# Patient Record
Sex: Female | Born: 1945 | Hispanic: No | Smoking: Former smoker
Health system: Southern US, Community
[De-identification: ages and names within clinical notes are randomized; demographics above are authoritative.]

## PROBLEM LIST (undated history)

## (undated) DIAGNOSIS — M1990 Unspecified osteoarthritis, unspecified site: Secondary | ICD-10-CM

## (undated) DIAGNOSIS — J189 Pneumonia, unspecified organism: Secondary | ICD-10-CM

## (undated) DIAGNOSIS — I10 Essential (primary) hypertension: Secondary | ICD-10-CM

## (undated) DIAGNOSIS — E785 Hyperlipidemia, unspecified: Secondary | ICD-10-CM

## (undated) DIAGNOSIS — J45909 Unspecified asthma, uncomplicated: Secondary | ICD-10-CM

## (undated) HISTORY — DX: Pneumonia, unspecified organism: J18.9

## (undated) HISTORY — DX: Hyperlipidemia, unspecified: E78.5

## (undated) HISTORY — DX: Unspecified asthma, uncomplicated: J45.909

## (undated) HISTORY — PX: OTHER SURGICAL HISTORY: SHX169

## (undated) HISTORY — DX: Unspecified osteoarthritis, unspecified site: M19.90

## (undated) HISTORY — DX: Essential (primary) hypertension: I10

---

## 1970-03-10 HISTORY — PX: TUBAL LIGATION: SHX77

## 2006-01-06 ENCOUNTER — Ambulatory Visit: Payer: Self-pay

## 2007-01-18 ENCOUNTER — Ambulatory Visit: Payer: Self-pay

## 2008-09-11 ENCOUNTER — Emergency Department: Payer: Self-pay | Admitting: Emergency Medicine

## 2009-09-13 ENCOUNTER — Emergency Department: Payer: Self-pay | Admitting: Emergency Medicine

## 2010-10-28 ENCOUNTER — Ambulatory Visit: Payer: Self-pay | Admitting: Family Medicine

## 2010-11-11 ENCOUNTER — Emergency Department: Payer: Self-pay | Admitting: Emergency Medicine

## 2012-01-22 ENCOUNTER — Ambulatory Visit: Payer: Self-pay | Admitting: Family Medicine

## 2013-01-11 ENCOUNTER — Ambulatory Visit: Payer: Self-pay | Admitting: Internal Medicine

## 2013-01-11 LAB — BASIC METABOLIC PANEL
Anion Gap: 2 — ABNORMAL LOW (ref 7–16)
BUN: 8 mg/dL (ref 7–18)
Calcium, Total: 10.1 mg/dL (ref 8.5–10.1)
EGFR (Non-African Amer.): >60
Potassium: 3.5 mmol/L (ref 3.5–5.1)

## 2013-01-11 LAB — HEPATIC FUNCTION PANEL A (ARMC)
Albumin: 3.5 g/dL (ref 3.4–5.0)
Alkaline Phosphatase: 143 U/L — ABNORMAL HIGH (ref 50–136)
Bilirubin,Total: 0.7 mg/dL (ref 0.2–1.0)

## 2013-01-11 LAB — CBC WITH DIFFERENTIAL/PLATELET
Basophil #: 0.1 10*3/uL (ref 0.0–0.1)
Basophil %: 1.1 %
HGB: 14.4 g/dL (ref 12.0–16.0)
Lymphocyte #: 2.1 10*3/uL (ref 1.0–3.6)
MCHC: 34.1 g/dL (ref 32.0–36.0)
Monocyte #: 1 x10 3/mm — ABNORMAL HIGH (ref 0.2–0.9)
Neutrophil #: 6.2 10*3/uL (ref 1.4–6.5)
Neutrophil %: 64.9 %
Platelet: 260 10*3/uL (ref 150–440)
RBC: 4.93 10*6/uL (ref 3.80–5.20)
RDW: 14.1 % (ref 11.5–14.5)

## 2013-01-11 LAB — LIPASE, BLOOD: Lipase: 113 U/L (ref 73–393)

## 2013-01-12 ENCOUNTER — Emergency Department: Payer: Self-pay | Admitting: Emergency Medicine

## 2013-03-21 ENCOUNTER — Emergency Department: Payer: Self-pay | Admitting: Emergency Medicine

## 2013-07-22 ENCOUNTER — Encounter: Payer: Self-pay | Admitting: General Surgery

## 2013-08-08 ENCOUNTER — Ambulatory Visit: Payer: Medicare Other | Admitting: General Surgery

## 2013-08-10 ENCOUNTER — Encounter: Payer: Self-pay | Admitting: *Deleted

## 2013-09-01 ENCOUNTER — Ambulatory Visit: Payer: Self-pay | Admitting: Specialist

## 2013-12-27 DIAGNOSIS — J45909 Unspecified asthma, uncomplicated: Secondary | ICD-10-CM | POA: Insufficient documentation

## 2013-12-27 DIAGNOSIS — M542 Cervicalgia: Secondary | ICD-10-CM | POA: Insufficient documentation

## 2013-12-27 DIAGNOSIS — M25512 Pain in left shoulder: Secondary | ICD-10-CM | POA: Insufficient documentation

## 2013-12-27 DIAGNOSIS — I10 Essential (primary) hypertension: Secondary | ICD-10-CM | POA: Insufficient documentation

## 2014-06-14 ENCOUNTER — Ambulatory Visit
Admit: 2014-06-14 | Disposition: A | Discharge status: Home / Self Care | Payer: Self-pay | Attending: Internal Medicine | Admitting: Internal Medicine

## 2014-06-30 NOTE — Consult Note (Signed)
PATIENT NAME:  Helen May, Helen May MR#:  [id number] DATE OF BIRTH:  07/12/1945  DATE OF CONSULTATION:  01/12/2013  CONSULTING PHYSICIAN:  Harrell Gave A. Elany Felix, MD  REASON FOR CONSULTATION: Suprapubic pain, CT scan with acute diverticulitis.   HISTORY OF PRESENT ILLNESS: Helen May is a pleasant 70 year old African-American female who presents with approximately 2 weeks of suprapubic pain. She says that her pain is usually minimal, except is worse with bowel movements, and is very minimal now. Otherwise, has been doing fine. No nausea or vomiting. He been eating okay. No diarrhea or constipation, dysuria or hematuria. Has never had a feeling like this before. Last colonoscopy, according to her, was 2 years ago at [facility].   PAST MEDICAL HISTORY: As follows:  1. Asthma.  2. Hypertension.   HOME MEDICINES:  1. Metoprolol 25 mg p.o. b.i.d.  2. Lovastatin 20 mg p.o. daily.  3. Hydrochlorothiazide 25 mg p.o. daily.   ALLERGIES: NEOSPORIN AND IV DYE.   SOCIAL HISTORY: Denies tobacco or alcohol use. No illegal drug use.   FAMILY HISTORY: Denies a history of cancer, heart disease, diabetes. Positive for hypertension.   REVIEW OF SYSTEMS: A 12-point review of systems was obtained, with pertinent positives and negatives as above.   PHYSICAL EXAMINATION:  VITAL SIGNS: Temperature 98.2, pulse 80, blood pressure 140/70, respirations 18, 99% on room air.  GENERAL: No acute distress. Alert and oriented x3.  HEAD: Normocephalic, atraumatic.  EYES: No scleral icterus. No conjunctivitis.  FACE: No obvious facial trauma. Normal external nose. Normal external ears.  CHEST: Lungs clear to auscultation. Moving air well.  HEART: Regular rate and rhythm. No murmurs, rubs or gallops.  ABDOMEN: Soft, nontender, nondistended.  EXTREMITIES: Moves all extremities well. Strength 5 out of 5.  NEUROLOGIC: Cranial nerves II through XII grossly intact. Sensation intact in all 4 extremities.   LABORATORY  DATA: Significant for yesterday a white cell count of 9.5, alkaline phosphatase of 143, otherwise unremarkable. Differential is 65%.   IMAGING: CT scan shows sigmoid diverticulitis with some inflammatory change, which may be phlegmon or early developing abscess.   ASSESSMENT AND PLAN: Helen May is a pleasant 69 year old female who presents with diverticulitis. She is in minimal pain. Her vital signs are stable. She does not have an elevated white cell count and is minimally tender on exam. I have offered her admission, but I have equally offered her, as she is reliable and has close family support, outpatient p.o. antibiotics with instructions to call if things worsen or if she is concerned. She would like to do the latter. Will follow up with me in approximately 1 week.   ____________________________ Glena Norfolk. Andreas Sobolewski, MD cal:lb D: 01/12/2013 12:48:41 ET T: 01/12/2013 13:05:00 ET JOB#: [id number]  cc: Harrell Gave A. Taliana Mersereau, MD, <Dictator> Floyde Parkins MD ELECTRONICALLY SIGNED 01/12/2013 15:20

## 2014-10-17 ENCOUNTER — Encounter: Payer: Self-pay | Admitting: *Deleted

## 2014-10-25 ENCOUNTER — Encounter: Payer: Self-pay | Admitting: General Surgery

## 2014-10-25 ENCOUNTER — Ambulatory Visit (INDEPENDENT_AMBULATORY_CARE_PROVIDER_SITE_OTHER): Payer: Medicare Other | Admitting: General Surgery

## 2014-10-25 VITALS — BP 132/84 | HR 77 | Resp 14 | Ht 63.0 in | Wt 179.0 lb

## 2014-10-25 DIAGNOSIS — D1720 Benign lipomatous neoplasm of skin and subcutaneous tissue of unspecified limb: Secondary | ICD-10-CM | POA: Diagnosis not present

## 2014-10-25 NOTE — Patient Instructions (Signed)
Lipoma  A lipoma is a noncancerous (benign) tumor composed of fat cells. They are usually found under the skin (subcutaneous). A lipoma may occur in any tissue of the body that contains fat. Common areas for lipomas to appear include the back, shoulders, buttocks, and thighs. Lipomas are a very common soft tissue growth. They are soft and grow slowly. Most problems caused by a lipoma depend on where it is growing.  DIAGNOSIS   A lipoma can be diagnosed with a physical exam. These tumors rarely become cancerous, but radiographic studies can help determine this for certain. Studies used may include:  · Computerized X-ray scans (CT or CAT scan).  · Computerized magnetic scans (MRI).  TREATMENT   Small lipomas that are not causing problems may be watched. If a lipoma continues to enlarge or causes problems, removal is often the best treatment. Lipomas can also be removed to improve appearance. Surgery is done to remove the fatty cells and the surrounding capsule. Most often, this is done with medicine that numbs the area (local anesthetic). The removed tissue is examined under a microscope to make sure it is not cancerous. Keep all follow-up appointments with your caregiver.  SEEK MEDICAL CARE IF:   · The lipoma becomes larger or hard.  · The lipoma becomes painful, red, or increasingly swollen. These could be signs of infection or a more serious condition.  Document Released: 02/14/2002 Document Revised: 05/19/2011 Document Reviewed: 07/27/2009  ExitCare® Patient Information ©2015 [employer]. This information is not intended to replace advice given to you by your health care provider. Make sure you discuss any questions you have with your health care provider.

## 2014-10-25 NOTE — Progress Notes (Signed)
Patient ID: Helen May, female   DOB: [birth year], 69 y.o.   MRN: [id number]  Chief Complaint  Patient presents with  . Other    lipoma left shoulder    HPI Helen May is a 69 y.o. female here for evaluation of a possible lipoma of the left shoulder. She states she first noticed this about 2 years ago. She states that the area can become tender but no pain. It does seem to change in size at times. It does get irritated by her bra strap.  HPI  Past Medical History  Diagnosis Date  . Asthma   . Pneumonia   . Hyperlipidemia   . Hypertension   . Arthritis     Past Surgical History  Procedure Laterality Date  . Tubal ligation  1972  . 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 Cervical cancer Daughter     Social History Social History  Substance Use Topics  . Smoking status: Former Smoker -- 20 years    Quit date: 03/10/1993  . Smokeless tobacco: Never Used  . Alcohol Use: No    Not on File  Current Outpatient Prescriptions  Medication Sig Dispense Refill  . atorvastatin (LIPITOR) 10 MG tablet Take 10 mg by mouth daily.    . cyclobenzaprine (FLEXERIL) 10 MG tablet Take 10 mg by mouth at bedtime.    . hydrochlorothiazide (HYDRODIURIL) 25 MG tablet Take 25 mg by mouth daily.    . meloxicam (MOBIC) 7.5 MG tablet Take 7.5 mg by mouth daily.     No current facility-administered medications for this visit.    Review of Systems Review of Systems  Constitutional: Negative.   Respiratory: Negative.   Cardiovascular: Negative.   Skin: Negative.     Blood pressure 132/84, pulse 77, resp. rate 14, height 5\' 3"  (1.6 m), weight 179 lb (81.194 kg).  Physical Exam Physical Exam  Constitutional: She is oriented to person, place, and time. She appears well-developed and well-nourished.  Eyes: Conjunctivae are normal.  Neck: Neck supple. No thyromegaly present.  Cardiovascular: Normal rate, regular rhythm and  normal heart sounds.   Pulmonary/Chest: Effort normal and breath sounds normal.  Lymphadenopathy:    She has no cervical adenopathy.  Neurological: She is alert and oriented to person, place, and time.  Skin:       Data Reviewed   Assessment    5 cm mass left shoulder-likely a lipoma. It is symptomatic. Can be excised safely here in office      Plan    Schedule excision of mass in office to be done under local anesthesia, discussed with patient and she is agreeable.     PCP: Dr Vidal Schwalbe 10/25/2014, 2:55 PM

## 2014-10-31 ENCOUNTER — Encounter: Payer: Self-pay | Admitting: General Surgery

## 2014-10-31 ENCOUNTER — Ambulatory Visit (INDEPENDENT_AMBULATORY_CARE_PROVIDER_SITE_OTHER): Payer: Medicare Other | Admitting: General Surgery

## 2014-10-31 VITALS — BP 138/84 | HR 86 | Resp 16 | Ht 63.0 in | Wt 178.0 lb

## 2014-10-31 DIAGNOSIS — D1720 Benign lipomatous neoplasm of skin and subcutaneous tissue of unspecified limb: Secondary | ICD-10-CM

## 2014-10-31 NOTE — Patient Instructions (Addendum)
Keep area clean Ice pack for comfort

## 2014-10-31 NOTE — Progress Notes (Signed)
Here today for excision of a left shoulder mass- possible lipoma.  Procedure note:  Patient was placed supine and slightly in the right lateral position. The mass in question was located on top of the left shoulder. This area was infiltrated with the 10 mL of 1% Xylocaine mixed with 0.5% Marcaine. Area was then prepped with chloral prep and then draped out. The medial lateral incision was made overlying this mass which measured approximately 5 cm. In the subcutaneous plane lobulated lipomatous mass was encountered. This was freed from the surrounding tissue and excised out fully. Bleeding was controlled with cautery and 2 suture ligatures of 3-0 Vicryl. The deeper tissues were closed with interrupted 3-0 Vicryl. Skin was closed with interrupted vertical mattress sutures of 3-0 nylon. Neosporin ointment Telfa gauze and Tegaderm were placed. Procedure was well-tolerated and no immediate problems encountered. Excised tissue was sent for pathology. Patient advised on wound care and is to return in approximately 10 days for removal of the stitches.  Cc: Casilda Carls, M.D.

## 2014-11-01 ENCOUNTER — Encounter: Payer: Self-pay | Admitting: General Surgery

## 2014-11-02 ENCOUNTER — Telehealth: Payer: Self-pay | Admitting: *Deleted

## 2014-11-02 NOTE — Telephone Encounter (Signed)
Patient notified that recent biopsy was benign. She verbalizes understanding.   This patient was instructed to keep follow up appointment as scheduled.

## 2014-11-10 ENCOUNTER — Ambulatory Visit (INDEPENDENT_AMBULATORY_CARE_PROVIDER_SITE_OTHER): Payer: Medicare Other

## 2014-11-10 DIAGNOSIS — D1720 Benign lipomatous neoplasm of skin and subcutaneous tissue of unspecified limb: Secondary | ICD-10-CM

## 2014-11-10 NOTE — Progress Notes (Signed)
Patient ID: Helen May, female   DOB: 02-12-1946, 70 y.o.   MRN: [id number] Patient came in today for a wound check. The wound is clean, with no signs of infection noted. Sutures removed, and steri strips placed. Patient aware of pathology results. Follow up as needed.

## 2015-04-10 ENCOUNTER — Other Ambulatory Visit: Payer: Self-pay | Admitting: Internal Medicine

## 2015-04-10 DIAGNOSIS — R059 Cough, unspecified: Secondary | ICD-10-CM

## 2015-04-10 DIAGNOSIS — R05 Cough: Secondary | ICD-10-CM

## 2015-04-12 ENCOUNTER — Ambulatory Visit: Payer: Medicare Other | Attending: Internal Medicine

## 2015-04-12 DIAGNOSIS — R059 Cough, unspecified: Secondary | ICD-10-CM

## 2015-04-12 DIAGNOSIS — R062 Wheezing: Secondary | ICD-10-CM | POA: Insufficient documentation

## 2015-04-12 DIAGNOSIS — R05 Cough: Secondary | ICD-10-CM | POA: Diagnosis not present

## 2015-04-12 MED ORDER — ALBUTEROL SULFATE (2.5 MG/3ML) 0.083% IN NEBU
2.5000 mg | INHALATION_SOLUTION | Freq: Once | RESPIRATORY_TRACT | Status: AC
  Administered 2015-04-12: 2.5 mg via RESPIRATORY_TRACT
  Filled 2015-04-12: qty 3

## 2015-04-24 ENCOUNTER — Ambulatory Visit (INDEPENDENT_AMBULATORY_CARE_PROVIDER_SITE_OTHER): Payer: Medicare Other | Admitting: Internal Medicine

## 2015-04-24 ENCOUNTER — Encounter: Payer: Self-pay | Admitting: Internal Medicine

## 2015-04-24 VITALS — BP 132/84 | HR 91 | Ht 63.0 in | Wt 183.4 lb

## 2015-04-24 DIAGNOSIS — J4550 Severe persistent asthma, uncomplicated: Secondary | ICD-10-CM

## 2015-04-24 MED ORDER — UMECLIDINIUM BROMIDE 62.5 MCG/INH IN AEPB: 1.0000 | INHALATION_SPRAY | Freq: Every day | RESPIRATORY_TRACT | Status: AC

## 2015-04-24 MED ORDER — PREDNISONE 20 MG PO TABS: 20.0000 mg | ORAL_TABLET | Freq: Every day | ORAL | Status: DC

## 2015-04-24 MED ORDER — PANTOPRAZOLE SODIUM 40 MG PO TBEC: 40.0000 mg | DELAYED_RELEASE_TABLET | Freq: Every day | ORAL | Status: DC

## 2015-04-24 MED ORDER — FLUTICASONE FUROATE-VILANTEROL 100-25 MCG/INH IN AEPB: 1.0000 | INHALATION_SPRAY | Freq: Every day | RESPIRATORY_TRACT | Status: AC

## 2015-04-24 MED ORDER — FLUTICASONE FUROATE-VILANTEROL 100-25 MCG/INH IN AEPB: 1.0000 | INHALATION_SPRAY | Freq: Every day | RESPIRATORY_TRACT | Status: DC

## 2015-04-24 MED ORDER — UMECLIDINIUM BROMIDE 62.5 MCG/INH IN AEPB: 1.0000 | INHALATION_SPRAY | Freq: Every day | RESPIRATORY_TRACT | Status: DC

## 2015-04-24 MED ORDER — ALBUTEROL SULFATE (2.5 MG/3ML) 0.083% IN NEBU: 5.0000 mg | INHALATION_SOLUTION | RESPIRATORY_TRACT | Status: DC | PRN

## 2015-04-24 NOTE — Progress Notes (Signed)
[facility] Consultation      Date: 04/24/2015,   MRN# [id number] VENERA GUIFARRO 07/24/1945 Code Status:  Code Status History    This patient does not have a recorded code status. Please follow your organizational policy for patients in this situation.     Hosp day:@LENGTHOFSTAYDAYS @ Referring MD: @ATDPROV @     PCP:      AdmissionWeight: 183 lb 6.4 oz (83.19 kg)                 CurrentWeight: 183 lb 6.4 oz (83.19 kg) Donnae W Geer is a 70 y.o. old female seen in consultation for ASTHMA at the request of Dr. Rosario Jacks     CHIEF COMPLAINT:   Wheezing and cough   HISTORY OF PRESENT ILLNESS  70 yo AAF seen today for persistent wheezing for last several months Patient Dx with ASTHMA 20 years ago  wheezing has been constant day and night, associated with cough and SOB Patient has been on and off prednisone for last 4 months-there are times the wheezing improves and times that it gets bad Patient has a 40 pack year tobacco abuse  She quit in 1995  She says her wheezing is def worse with smoke She has no fevers, chills or signs of infection at this time She is retired and has worked in Corporate treasurer for [employer] would constantly be getting sick from sick contacts She states that she has tried Advair/Spiriva/ALB nebs/proair She has ad reactions of palpitations,thrush in the past She had seen a Pulmonologist in the past...but no follow up No signs of infection at this time She st No si PAST MEDICAL HISTORY   Past Medical History  Diagnosis Date  . Asthma   . Pneumonia   . Hyperlipidemia   . Hypertension   . Arthritis      SURGICAL HISTORY   Past Surgical History  Procedure Laterality Date  . Tubal ligation  1972  . 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 Cervical cancer Daughter      SOCIAL HISTORY   Social History  Substance Use Topics  . Smoking status:  Former Smoker -- 20 years    Quit date: 03/10/1993  . Smokeless tobacco: Never Used  . Alcohol Use: No     MEDICATIONS    Home Medication:  Current Outpatient Rx  Name  Route  Sig  Dispense  Refill  . albuterol (PROVENTIL) (2.5 MG/3ML) 0.083% nebulizer solution      5 mg.         . azithromycin (ZITHROMAX Z-PAK) 250 MG tablet   Oral   Take 250 mg by mouth.         . cyclobenzaprine (FLEXERIL) 10 MG tablet   Oral   Take 10 mg by mouth as needed (at bedtime).          . hydrochlorothiazide (HYDRODIURIL) 25 MG tablet   Oral   Take 25 mg by mouth daily.         Marland Kitchen ipratropium (ATROVENT) 0.02 % nebulizer solution               . meloxicam (MOBIC) 7.5 MG tablet   Oral   Take 7.5 mg by mouth as needed.          . predniSONE (DELTASONE) 20 MG tablet               .  PROAIR HFA 108 (90 Base) MCG/ACT inhaler                 Dispense as written.     Current Medication:  Current outpatient prescriptions:  .  albuterol (PROVENTIL) (2.5 MG/3ML) 0.083% nebulizer solution, 5 mg., Disp: , Rfl:  .  azithromycin (ZITHROMAX Z-PAK) 250 MG tablet, Take 250 mg by mouth., Disp: , Rfl:  .  cyclobenzaprine (FLEXERIL) 10 MG tablet, Take 10 mg by mouth as needed (at bedtime). , Disp: , Rfl:  .  hydrochlorothiazide (HYDRODIURIL) 25 MG tablet, Take 25 mg by mouth daily., Disp: , Rfl:  .  ipratropium (ATROVENT) 0.02 % nebulizer solution, , Disp: , Rfl:  .  meloxicam (MOBIC) 7.5 MG tablet, Take 7.5 mg by mouth as needed. , Disp: , Rfl:  .  predniSONE (DELTASONE) 20 MG tablet, , Disp: , Rfl:  .  PROAIR HFA 108 (90 Base) MCG/ACT inhaler, , Disp: , Rfl:     ALLERGIES   Review of patient's allergies indicates no known allergies.     REVIEW OF SYSTEMS   Review of Systems  Constitutional: Positive for malaise/fatigue. Negative for fever, chills, weight loss and diaphoresis.  HENT: Positive for congestion. Negative for hearing loss and tinnitus.   Eyes: Negative for  blurred vision and double vision.  Respiratory: Positive for cough, shortness of breath and wheezing. Negative for hemoptysis and sputum production.   Cardiovascular: Negative for chest pain, palpitations, orthopnea and leg swelling.  Gastrointestinal: Negative for heartburn, nausea, vomiting and abdominal pain.  Genitourinary: Negative for dysuria.  Musculoskeletal: Negative for myalgias, back pain and neck pain.  Skin: Negative for rash.  Neurological: Negative for dizziness, tingling and headaches.  Endo/Heme/Allergies: Does not bruise/bleed easily.  Psychiatric/Behavioral: Negative for depression. The patient is not nervous/anxious.      VS: BP 132/84 mmHg  Pulse 91  Ht 5\' 3"  (1.6 m)  Wt 183 lb 6.4 oz (83.19 kg)  BMI 32.50 kg/m2  SpO2 95%     PHYSICAL EXAM  Physical Exam  Constitutional: She is oriented to person, place, and time. She appears well-developed and well-nourished. No distress.  HENT:  Head: Normocephalic and atraumatic.  Mouth/Throat: No oropharyngeal exudate.  Eyes: EOM are normal. Pupils are equal, round, and reactive to light. No scleral icterus.  Neck: Normal range of motion. Neck supple.  Cardiovascular: Normal rate, regular rhythm and normal heart sounds.   No murmur heard. Pulmonary/Chest: No stridor. No respiratory distress. She has wheezes. She has rales.  Abdominal: Soft. Bowel sounds are normal. There is no tenderness.  Musculoskeletal: Normal range of motion. She exhibits no edema.  Neurological: She is alert and oriented to person, place, and time. She displays normal reflexes. Coordination normal.  Skin: Skin is warm. She is not diaphoretic.  Psychiatric: She has a normal mood and affect.      ASSESSMENT/PLAN   70 yo AAF seen today for severe persistent ASTHMA since 3 months with intermittent prednisone tapers and abx  Patient will need aggressive BD therapy and will need close follow up, will need PFT;s at some point to assess resp  status/function  1.start Breo 2.start Incruse 3.ALB neb every 6 hrs 4.prednisone 40 mg daily for 10 days 5.start protonix 6.allergy referral  Close Follow up in 2 weeks  The Patient requires high complexity decision making for assessment and support, frequent evaluation and titration of therapies, application of advanced monitoring technologies and extensive interpretation of multiple databases.   Patient satisfied with Plan  of action and management. All questions answered  Corrin Parker, M.D.  [facility]  Medical Director [place] Director [facility] Cardio-Pulmonary Department

## 2015-04-24 NOTE — Patient Instructions (Addendum)
Asthma Attack Prevention While you may not be able to control the fact that you have asthma, you can take actions to prevent asthma attacks. The best way to prevent asthma attacks is to maintain good control of your asthma. You can achieve this by:  Taking your medicines as directed.  Avoiding things that can irritate your airways or make your asthma symptoms worse (asthma triggers).  Keeping track of how well your asthma is controlled and of any changes in your symptoms.  Responding quickly to worsening asthma symptoms (asthma attack).  Seeking emergency care when it is needed. WHAT ARE SOME WAYS TO PREVENT AN ASTHMA ATTACK? Have a Plan Work with your health care provider to create a written plan for managing and treating your asthma attacks (asthma action plan). This plan includes:  A list of your asthma triggers and how you can avoid them.  Information on when medicines should be taken and when their dosages should be changed.  The use of a device that measures how well your lungs are working (peak flow meter). Monitor Your Asthma Use your peak flow meter and record your results in a journal every day. A drop in your peak flow numbers on one or more days may indicate the start of an asthma attack. This can happen even before you start to feel symptoms. You can prevent an asthma attack from getting worse by following the steps in your asthma action plan. Avoid Asthma Triggers Work with your asthma health care provider to find out what your asthma triggers are. This can be done by:  Allergy testing.  Keeping a journal that notes when asthma attacks occur and the factors that may have contributed to them.  Determining if there are other medical conditions that are making your asthma worse. Once you have determined your asthma triggers, take steps to avoid them. This may include avoiding excessive or prolonged exposure to:  Dust. Have someone dust and vacuum your home for you once or  twice a week. Using a high-efficiency particulate arrestance (HEPA) vacuum is best.  Smoke. This includes campfire smoke, forest fire smoke, and secondhand smoke from tobacco products.  Pet dander. Avoid contact with animals that you know you are allergic to.  Allergens from trees, grasses or pollens. Avoid spending a lot of time outdoors when pollen counts are high, and on very windy days.  Very cold, dry, or humid air.  Mold.  Foods that contain high amounts of sulfites.  Strong odors.  Outdoor air pollutants, such as engine exhaust.  Indoor air pollutants, such as aerosol sprays and fumes from household cleaners.  Household pests, including dust mites and cockroaches, and pest droppings.  Certain medicines, including NSAIDs. Always talk to your health care provider before stopping or starting any new medicines. Medicines Take over-the-counter and prescription medicines only as told by your health care provider. Many asthma attacks can be prevented by carefully following your medicine schedule. Taking your medicines correctly is especially important when you cannot avoid certain asthma triggers. Act Quickly If an asthma attack does happen, acting quickly can decrease how severe it is and how long it lasts. Take these steps:   Pay attention to your symptoms. If you are coughing, wheezing, or having difficulty breathing, do not wait to see if your symptoms go away on their own. Follow your asthma action plan.  If you have followed your asthma action plan and your symptoms are not improving, call your health care provider or seek immediate medical care   at the nearest hospital. It is important to note how often you need to use your fast-acting rescue inhaler. If you are using your rescue inhaler more often, it may mean that your asthma is not under control. Adjusting your asthma treatment plan may help you to prevent future asthma attacks and help you to gain better control of your  condition. HOW CAN I PREVENT AN ASTHMA ATTACK WHEN I EXERCISE? Follow advice from your health care provider about whether you should use your fast-acting inhaler before exercising. Many people with asthma experience exercise-induced bronchoconstriction (EIB). This condition often worsens during vigorous exercise in cold, humid, or dry environments. Usually, people with EIB can stay very active by pre-treating with a fast-acting inhaler before exercising.   This information is not intended to replace advice given to you by your health care provider. Make sure you discuss any questions you have with your health care provider.   Document Released: 02/12/2009 Document Revised: 11/15/2014 Document Reviewed: 07/27/2014 Elsevier Interactive Patient Education 2016 [place] once daily START INCRUSE once daily Take ALB SOLUTION EVERY 6 hrs Prednisone 40 mg daily for 10 days Protonix 40 mg daily

## 2015-04-25 ENCOUNTER — Other Ambulatory Visit: Payer: Self-pay

## 2015-04-25 DIAGNOSIS — J4550 Severe persistent asthma, uncomplicated: Secondary | ICD-10-CM

## 2015-05-01 ENCOUNTER — Institutional Professional Consult (permissible substitution): Payer: Self-pay | Admitting: Internal Medicine

## 2015-05-09 ENCOUNTER — Telehealth: Payer: Self-pay | Admitting: Pulmonary Disease

## 2015-05-09 NOTE — Telephone Encounter (Signed)
Spoke with pt and she states that she took Protonix about 9am and states that around 11am that she started having sharp chest pains with pain on the Rt side of her neck and in her Rt shoulder and shoulder blade. States it went away for a little bit but has now came back with the same symptoms. Ask pt if she felt like someone was sitting on her chest and she denies it. Informed pt she should go to ER to be evaluated. Ask pt if someone was there with her that could take her and she replied yes. Pt kept saying she doesn't have a cardiac hx. Informed pt she still needed to be checked out at the ER and she states she would go.  Spoke with VM and he agrees with what pt was told.

## 2015-05-09 NOTE — Telephone Encounter (Signed)
Pt calling stating all day today she's been having pains. Neck back and in chest Thinks it is her PROTONIX She looked up some side effects to this and thinks this is why she's having those pain Would like to know what to do Please advise.

## 2015-05-10 ENCOUNTER — Ambulatory Visit (INDEPENDENT_AMBULATORY_CARE_PROVIDER_SITE_OTHER): Payer: Medicare Other | Admitting: Pulmonary Disease

## 2015-05-10 ENCOUNTER — Encounter: Payer: Self-pay | Admitting: Pulmonary Disease

## 2015-05-10 VITALS — BP 134/78 | HR 118 | Ht 63.0 in | Wt 181.6 lb

## 2015-05-10 DIAGNOSIS — Z9114 Patient's other noncompliance with medication regimen: Secondary | ICD-10-CM | POA: Diagnosis not present

## 2015-05-10 DIAGNOSIS — J4551 Severe persistent asthma with (acute) exacerbation: Secondary | ICD-10-CM

## 2015-05-10 MED ORDER — IPRATROPIUM-ALBUTEROL 0.5-2.5 (3) MG/3ML IN SOLN
3.0000 mL | Freq: Once | RESPIRATORY_TRACT | Status: AC
  Administered 2015-05-10: 3 mL via RESPIRATORY_TRACT

## 2015-05-10 MED ORDER — MONTELUKAST SODIUM 10 MG PO TABS: 10.0000 mg | ORAL_TABLET | Freq: Every day | ORAL | Status: DC

## 2015-05-10 NOTE — Patient Instructions (Signed)
You received a nebulized treatment today in the office When you go home, take your Breo and Incruse inhalers as prescribed Stop Protonix Continue prednisone as previously prescribed by Dr Mortimer Fries I have added a new asthma medication called Singulair to be taken once a day at bedtime We will call you tomorrow to make sure you are feeling better  Follow up with me next week on Monday

## 2015-05-10 NOTE — Progress Notes (Signed)
ACUTE PULMONARY OFFICE VISIT  PT PROFILE: 7 F pt of Dr Mortimer Fries with severe persistent asthma initially seen in consultation 04/24/15. Started on Breo, Incruse, Protonix and given prednisone 40 mg daily X 10 days.  SUBJ: Started protonix yesterday and developed HA and severe R sided chest pain (she believes this is cause and effect). Called office to be seen today. C/O severe dyspnea. R sided CP is subsiding. She has not taken any of the previously prescribed meds on the day of this encounter. Denies fever, purulent sputum, hemoptysis  OBJ:  Filed Vitals:   05/10/15 0925  BP: 134/78  Pulse: 118  Height: 5\' 3"  (1.6 m)  Weight: 181 lb 9.6 oz (82.373 kg)  SpO2: 100%   Appears moderately dyspneic @ rest HEENT: All WNL Neck: NO LAN, no JVD noted Lungs: full BS, diffuse scattered wheezes, + pseudowheeze Cardiovascular: Reg rate, normal rhythm, no M noted Abdomen: Soft, NT +BS Ext: no C/C/E Neuro: grossly intact  She received nebulized albuterol and ipratropium in the office with improvement in dyspnea. Wheezing remained very prominent after the treatment  No new data  IMPRESSION: Severe persistent asthma with acute exacerbation Possible adverse reaction to Protonix Medical noncompliance  PLAN: Continue Rx as outlined in Dr Zoila Shutter previous note except Protonix I counseled re: the importance of using her controlled meds as prescibed If we continue to believe GERD is an exacerbating factor, we can consider H2RA in the future Our office will cal her tomorrow to check on her ROV 05/14/15 with me for recheck In the long term, she might be a candidate for Nucala or Xolair  Merton Border, MD PCCM service Mobile [phone] Pager [phone]

## 2015-05-11 ENCOUNTER — Telehealth: Payer: Self-pay | Admitting: *Deleted

## 2015-05-11 NOTE — Telephone Encounter (Signed)
Tried to call pt to see how she is doing today per DS. LMOM for pt to call back.

## 2015-05-14 ENCOUNTER — Ambulatory Visit: Payer: Medicare Other | Admitting: Pulmonary Disease

## 2015-05-14 NOTE — Telephone Encounter (Signed)
Tried to call pt to f/u to see how she is doing. LMOM. Will close telephone note.

## 2015-05-24 ENCOUNTER — Ambulatory Visit: Payer: Medicare Other | Admitting: Internal Medicine

## 2015-10-15 ENCOUNTER — Other Ambulatory Visit: Payer: Self-pay | Admitting: Internal Medicine

## 2015-10-15 DIAGNOSIS — Z1231 Encounter for screening mammogram for malignant neoplasm of breast: Secondary | ICD-10-CM

## 2015-10-30 ENCOUNTER — Ambulatory Visit
Admission: RE | Admit: 2015-10-30 | Discharge: 2015-10-30 | Disposition: A | Discharge status: Home / Self Care | Payer: Medicare Other | Source: Ambulatory Visit | Attending: Internal Medicine | Admitting: Internal Medicine

## 2015-10-30 ENCOUNTER — Other Ambulatory Visit: Payer: Self-pay | Admitting: Internal Medicine

## 2015-10-30 ENCOUNTER — Telehealth: Payer: Self-pay | Admitting: Gastroenterology

## 2015-10-30 DIAGNOSIS — Z1231 Encounter for screening mammogram for malignant neoplasm of breast: Secondary | ICD-10-CM | POA: Insufficient documentation

## 2015-10-30 NOTE — Telephone Encounter (Signed)
colonoscopy

## 2015-10-31 NOTE — Telephone Encounter (Signed)
Called patient and LVM.

## 2015-11-02 NOTE — Telephone Encounter (Signed)
Called patient and LVM.

## 2015-11-02 NOTE — Telephone Encounter (Signed)
Notification letter sent to address on file

## 2015-12-31 ENCOUNTER — Telehealth: Payer: Self-pay | Admitting: Gastroenterology

## 2015-12-31 NOTE — Telephone Encounter (Signed)
Colonoscopy please schedule with Maricela Curet. Also, please double check insurance and add. Thank you :)

## 2016-01-02 ENCOUNTER — Other Ambulatory Visit: Payer: Self-pay

## 2016-01-02 NOTE — Telephone Encounter (Signed)
Gastroenterology Pre-Procedure Review  Request Date: 01/10/2016 Requesting Physician: Dr. Vicente Males  PATIENT REVIEW QUESTIONS: The patient responded to the following health history questions as indicated:    1. Are you having any GI issues? no 2. Do you have a personal history of Polyps? yes (yes, benign) 3. Do you have a family history of Colon Cancer or Polyps? no 4. Diabetes Mellitus? no 5. Joint replacements in the past 12 months?no 6. Major health problems in the past 3 months?no 7. Any artificial heart valves, MVP, or defibrillator?no    MEDICATIONS & ALLERGIES:    Patient reports the following regarding taking any anticoagulation/antiplatelet therapy:   Plavix, Coumadin, Eliquis, Xarelto, Lovenox, Pradaxa, Brilinta, or Effient? no Aspirin? no  Patient confirms/reports the following medications:  Current Outpatient Prescriptions  Medication Sig Dispense Refill  . hydrochlorothiazide (HYDRODIURIL) 25 MG tablet Take 25 mg by mouth daily.    Marland Kitchen ipratropium (ATROVENT) 0.02 % nebulizer solution     . meloxicam (MOBIC) 7.5 MG tablet Take 7.5 mg by mouth as needed.     . montelukast (SINGULAIR) 10 MG tablet Take 1 tablet (10 mg total) by mouth at bedtime. 30 tablet 11  . predniSONE (DELTASONE) 20 MG tablet Reported on 05/10/2015    . umeclidinium bromide (INCRUSE ELLIPTA) 62.5 MCG/INH AEPB Inhale 1 puff into the lungs daily. 30 each 5   No current facility-administered medications for this visit.     Patient confirms/reports the following allergies:  No Known Allergies  No orders of the defined types were placed in this encounter.   AUTHORIZATION INFORMATION Primary Insurance: 1D#: Group #:  Secondary Insurance: 1D#: Group #:  SCHEDULE INFORMATION: Date: 01/10/2016 Time: Location: [facility]

## 2016-01-02 NOTE — Telephone Encounter (Signed)
Screening Colonoscopy Z12.11 [facility] [id number] [employer] Pre cert

## 2016-01-03 ENCOUNTER — Other Ambulatory Visit: Payer: Self-pay

## 2016-01-03 NOTE — Telephone Encounter (Signed)
Please pre cert this one for me. Thank you

## 2016-01-03 NOTE — Telephone Encounter (Signed)
Approved--[id number]

## 2016-01-09 ENCOUNTER — Encounter: Payer: Self-pay | Admitting: *Deleted

## 2016-01-14 ENCOUNTER — Telehealth: Payer: Self-pay | Admitting: Gastroenterology

## 2016-01-14 NOTE — Telephone Encounter (Signed)
Patient needs to cancel colonoscopy Thursday and will call back to reschedule. No transportation

## 2016-01-17 ENCOUNTER — Encounter: Admission: RE | Payer: Self-pay | Source: Ambulatory Visit

## 2016-01-17 ENCOUNTER — Ambulatory Visit: Admission: RE | Admit: 2016-01-17 | Payer: Medicare Other | Source: Ambulatory Visit

## 2016-01-17 SURGERY — COLONOSCOPY WITH PROPOFOL
Anesthesia: General

## 2016-04-10 ENCOUNTER — Other Ambulatory Visit: Payer: Self-pay | Admitting: Internal Medicine

## 2016-04-10 ENCOUNTER — Ambulatory Visit
Admission: RE | Admit: 2016-04-10 | Discharge: 2016-04-10 | Disposition: A | Discharge status: Home / Self Care | Payer: Medicare Other | Source: Ambulatory Visit | Attending: Internal Medicine | Admitting: Internal Medicine

## 2016-04-10 DIAGNOSIS — M79662 Pain in left lower leg: Secondary | ICD-10-CM

## 2016-04-10 DIAGNOSIS — M79609 Pain in unspecified limb: Secondary | ICD-10-CM | POA: Diagnosis present

## 2017-11-18 ENCOUNTER — Emergency Department: Payer: Medicare Other

## 2017-11-18 ENCOUNTER — Inpatient Hospital Stay
Admission: EM | Admit: 2017-11-18 | Discharge: 2017-11-22 | DRG: 392 | Disposition: A | Discharge status: Home / Self Care | Payer: Medicare Other | Attending: Internal Medicine | Admitting: Internal Medicine

## 2017-11-18 ENCOUNTER — Encounter: Payer: Self-pay | Admitting: Emergency Medicine

## 2017-11-18 ENCOUNTER — Other Ambulatory Visit: Payer: Self-pay

## 2017-11-18 DIAGNOSIS — D179 Benign lipomatous neoplasm, unspecified: Secondary | ICD-10-CM | POA: Diagnosis present

## 2017-11-18 DIAGNOSIS — Z8049 Family history of malignant neoplasm of other genital organs: Secondary | ICD-10-CM

## 2017-11-18 DIAGNOSIS — R109 Unspecified abdominal pain: Secondary | ICD-10-CM

## 2017-11-18 DIAGNOSIS — J45909 Unspecified asthma, uncomplicated: Secondary | ICD-10-CM | POA: Diagnosis present

## 2017-11-18 DIAGNOSIS — I10 Essential (primary) hypertension: Secondary | ICD-10-CM | POA: Diagnosis present

## 2017-11-18 DIAGNOSIS — Z9851 Tubal ligation status: Secondary | ICD-10-CM

## 2017-11-18 DIAGNOSIS — E739 Lactose intolerance, unspecified: Secondary | ICD-10-CM | POA: Diagnosis present

## 2017-11-18 DIAGNOSIS — K5720 Diverticulitis of large intestine with perforation and abscess without bleeding: Principal | ICD-10-CM | POA: Diagnosis present

## 2017-11-18 DIAGNOSIS — B961 Klebsiella pneumoniae [K. pneumoniae] as the cause of diseases classified elsewhere: Secondary | ICD-10-CM | POA: Diagnosis present

## 2017-11-18 DIAGNOSIS — E785 Hyperlipidemia, unspecified: Secondary | ICD-10-CM | POA: Diagnosis present

## 2017-11-18 DIAGNOSIS — Z87891 Personal history of nicotine dependence: Secondary | ICD-10-CM

## 2017-11-18 LAB — CBC
HCT: 41.2 % (ref 35.0–47.0)
Hemoglobin: 14 g/dL (ref 12.0–16.0)
MCH: 30.1 pg (ref 26.0–34.0)
MCHC: 33.9 g/dL (ref 32.0–36.0)
MCV: 88.7 fL (ref 80.0–100.0)
PLATELETS: 255 10*3/uL (ref 150–440)
RBC: 4.64 MIL/uL (ref 3.80–5.20)
RDW: 14.4 % (ref 11.5–14.5)
WBC: 9.3 10*3/uL (ref 3.6–11.0)

## 2017-11-18 LAB — URINALYSIS, COMPLETE (UACMP) WITH MICROSCOPIC
Bacteria, UA: NONE SEEN
Bilirubin Urine: NEGATIVE
GLUCOSE, UA: NEGATIVE mg/dL
HGB URINE DIPSTICK: NEGATIVE
KETONES UR: NEGATIVE mg/dL
Leukocytes, UA: NEGATIVE
NITRITE: NEGATIVE
PH: 7 (ref 5.0–8.0)
Protein, ur: NEGATIVE mg/dL
Specific Gravity, Urine: 1.016 (ref 1.005–1.030)

## 2017-11-18 LAB — COMPREHENSIVE METABOLIC PANEL
ALK PHOS: 94 U/L (ref 38–126)
ALT: 17 U/L (ref 0–44)
ANION GAP: 7 (ref 5–15)
AST: 15 U/L (ref 15–41)
Albumin: 3.8 g/dL (ref 3.5–5.0)
BILIRUBIN TOTAL: 0.7 mg/dL (ref 0.3–1.2)
BUN: 10 mg/dL (ref 8–23)
CALCIUM: 10.4 mg/dL — AB (ref 8.9–10.3)
CO2: 25 mmol/L (ref 22–32)
CREATININE: 0.77 mg/dL (ref 0.44–1.00)
Chloride: 106 mmol/L (ref 98–111)
Glucose, Bld: 111 mg/dL — ABNORMAL HIGH (ref 70–99)
Potassium: 3.7 mmol/L (ref 3.5–5.1)
SODIUM: 138 mmol/L (ref 135–145)
TOTAL PROTEIN: 7.9 g/dL (ref 6.5–8.1)

## 2017-11-18 LAB — LIPASE, BLOOD: Lipase: 28 U/L (ref 11–51)

## 2017-11-18 MED ORDER — ONDANSETRON HCL 4 MG/2ML IJ SOLN
4.00 | INTRAMUSCULAR | Status: DC
Start: ? — End: 2017-11-18

## 2017-11-18 MED ORDER — IPRATROPIUM BROMIDE 0.02 % IN SOLN: 0.5000 mg | Freq: Four times a day (QID) | RESPIRATORY_TRACT | Status: DC | PRN

## 2017-11-18 MED ORDER — MORPHINE SULFATE (PF) 4 MG/ML IV SOLN
4.0000 mg | INTRAVENOUS | Status: DC | PRN
  Administered 2017-11-19 – 2017-11-20 (×2): 4 mg via INTRAVENOUS
  Filled 2017-11-18 (×2): qty 1

## 2017-11-18 MED ORDER — ONDANSETRON HCL 4 MG/2ML IJ SOLN: 4.0000 mg | Freq: Four times a day (QID) | INTRAMUSCULAR | Status: DC | PRN

## 2017-11-18 MED ORDER — METRONIDAZOLE IN NACL 5-0.79 MG/ML-% IV SOLN
500.0000 mg | Freq: Three times a day (TID) | INTRAVENOUS | Status: DC
  Administered 2017-11-18 – 2017-11-19 (×2): 500 mg via INTRAVENOUS
  Filled 2017-11-18 (×6): qty 100

## 2017-11-18 MED ORDER — MORPHINE SULFATE (PF) 4 MG/ML IV SOLN
4.0000 mg | INTRAVENOUS | Status: DC | PRN
  Administered 2017-11-18 (×2): 4 mg via INTRAVENOUS
  Filled 2017-11-18 (×2): qty 1

## 2017-11-18 MED ORDER — SODIUM CHLORIDE 0.9 % IV SOLN
2.0000 g | INTRAVENOUS | Status: DC
  Administered 2017-11-18: 2 g via INTRAVENOUS
  Filled 2017-11-18 (×2): qty 20

## 2017-11-18 MED ORDER — HYDRALAZINE HCL 20 MG/ML IJ SOLN
10.00 | INTRAMUSCULAR | Status: DC
Start: ? — End: 2017-11-18

## 2017-11-18 MED ORDER — OXYCODONE HCL 5 MG PO TABS
5.00 | ORAL_TABLET | ORAL | Status: DC
Start: ? — End: 2017-11-18

## 2017-11-18 MED ORDER — PIPERACILLIN SOD-TAZOBACTAM SO 3.375 (3-0.375) G IV SOLR
3.38 | INTRAVENOUS | Status: DC
Start: 2017-11-17 — End: 2017-11-18

## 2017-11-18 MED ORDER — LABETALOL HCL 5 MG/ML IV SOLN
5.0000 mg | Freq: Once | INTRAVENOUS | Status: AC
  Administered 2017-11-18: 5 mg via INTRAVENOUS
  Filled 2017-11-18: qty 4

## 2017-11-18 MED ORDER — MORPHINE SULFATE (PF) 2 MG/ML IV SOLN: 2.0000 mg | INTRAVENOUS | Status: DC | PRN

## 2017-11-18 MED ORDER — DEXTROSE IN LACTATED RINGERS 5 % IV SOLN
100.00 | INTRAVENOUS | Status: DC
Start: ? — End: 2017-11-18

## 2017-11-18 MED ORDER — SODIUM CHLORIDE 0.9 % IV BOLUS
500.0000 mL | Freq: Once | INTRAVENOUS | Status: AC
  Administered 2017-11-18: 500 mL via INTRAVENOUS

## 2017-11-18 MED ORDER — ACETAMINOPHEN 10 MG/ML IV SOLN
650.00 | INTRAVENOUS | Status: DC
Start: ? — End: 2017-11-18

## 2017-11-18 MED ORDER — MONTELUKAST SODIUM 10 MG PO TABS
10.0000 mg | ORAL_TABLET | Freq: Every day | ORAL | Status: DC
  Filled 2017-11-18 (×2): qty 1

## 2017-11-18 MED ORDER — SODIUM CHLORIDE 0.9 % IV SOLN
INTRAVENOUS | Status: DC
  Administered 2017-11-18 – 2017-11-21 (×7): via INTRAVENOUS

## 2017-11-18 MED ORDER — ALBUTEROL SULFATE (2.5 MG/3ML) 0.083% IN NEBU
5.00 | INHALATION_SOLUTION | RESPIRATORY_TRACT | Status: DC
Start: ? — End: 2017-11-18

## 2017-11-18 MED ORDER — HEPARIN SODIUM (PORCINE) 5000 UNIT/ML IJ SOLN
5000.0000 [IU] | Freq: Three times a day (TID) | INTRAMUSCULAR | Status: DC
  Administered 2017-11-21 – 2017-11-22 (×3): 5000 [IU] via SUBCUTANEOUS
  Filled 2017-11-18 (×7): qty 1

## 2017-11-18 MED ORDER — MORPHINE SULFATE (PF) 2 MG/ML IV SOLN
2.0000 mg | INTRAVENOUS | Status: DC | PRN
  Administered 2017-11-19 (×3): 2 mg via INTRAVENOUS
  Filled 2017-11-18 (×3): qty 1

## 2017-11-18 MED ORDER — DOCUSATE SODIUM 100 MG PO CAPS: 100.0000 mg | ORAL_CAPSULE | Freq: Two times a day (BID) | ORAL | Status: DC | PRN

## 2017-11-18 MED ORDER — MORPHINE SULFATE 2 MG/ML IJ SOLN
2.00 | INTRAMUSCULAR | Status: DC
Start: ? — End: 2017-11-18

## 2017-11-18 MED ORDER — ONDANSETRON HCL 4 MG/2ML IJ SOLN
4.0000 mg | Freq: Once | INTRAMUSCULAR | Status: AC
  Administered 2017-11-18: 4 mg via INTRAVENOUS
  Filled 2017-11-18: qty 2

## 2017-11-18 MED ORDER — HYDROCHLOROTHIAZIDE 25 MG PO TABS
25.0000 mg | ORAL_TABLET | Freq: Every day | ORAL | Status: DC
  Administered 2017-11-19 – 2017-11-22 (×3): 25 mg via ORAL
  Filled 2017-11-18 (×5): qty 1

## 2017-11-18 MED ORDER — HEPARIN SODIUM (PORCINE) 5000 UNIT/ML IJ SOLN
5000.00 | INTRAMUSCULAR | Status: DC
Start: 2017-11-17 — End: 2017-11-18

## 2017-11-18 NOTE — ED Notes (Signed)
ED Provider at bedside. 

## 2017-11-18 NOTE — H&P (Signed)
[place] [facility] [place] NAME: Helen May    MR#:  [id number]  DATE OF BIRTH:  [birth year]  DATE OF ADMISSION:  11/18/2017  PRIMARY CARE PHYSICIAN: Casilda Carls, MD (Inactive)   REQUESTING/REFERRING PHYSICIAN: Quentin Cornwall  CHIEF COMPLAINT:   Chief Complaint  Patient presents with  . Abdominal Pain    HISTORY OF PRESENT ILLNESS: Helen May  is a 72 y.o. female with a known history of arthritis, asthma, hyperlipidemia, hypertension, pneumonia-started having abdominal pain mainly on the left lower side for last 4 to 5 days which is constant and worse after eating.  No associated vomiting or diarrhea.  No fever or chills.  Concerned with this she tried to call her primary care doctor's office but could not speak to him so yesterday went to [facility] emergency room.  They had seen her and done a CT scan on the abdomen which showed sigmoid diverticulitis with small abscess, there was a long delay in her care as per the patient and she was not supposed to get the room for another 1 day so she called her primary care doctor's office and he suggested to come here. Patient signed Belle [facility] from [facility] they gave her prescriptions on oral antibiotics but today her pain was still out of control so she called Dr.Jadali's office he suggested to get admitted here and he will contact Dr. Bary Castilla. ER physician had tried contacting Dr. Charissa Bash but no response from him so finally spoke to on-call surgical Dr. Reita May suggested to admit to medical service for IV antibiotics and if needed then they would call IR for drainage.  PAST MEDICAL HISTORY:   Past Medical History:  Diagnosis Date  . Arthritis   . Asthma   . Hyperlipidemia   . Hypertension   . Pneumonia     PAST SURGICAL HISTORY:  Past Surgical History:  Procedure Laterality Date  . Removal of Carotid Mass Right 15 years ago  . TUBAL LIGATION  1972    SOCIAL HISTORY:  Social  History   Tobacco Use  . Smoking status: Former Smoker    Years: 20.00    Last attempt to quit: 03/10/1993    Years since quitting: 24.7  . Smokeless tobacco: Never Used  Substance Use Topics  . Alcohol use: No    Alcohol/week: 0.0 standard drinks    FAMILY HISTORY:  Family History  Problem Relation Age of Onset  . Cervical cancer Daughter   . Breast cancer Neg Hx     DRUG ALLERGIES: No Known Allergies  REVIEW OF SYSTEMS:   CONSTITUTIONAL: No fever, fatigue or weakness.  EYES: No blurred or double vision.  EARS, NOSE, AND THROAT: No tinnitus or ear pain.  RESPIRATORY: No cough, shortness of breath, wheezing or hemoptysis.  CARDIOVASCULAR: No chest pain, orthopnea, edema.  GASTROINTESTINAL: No nausea, vomiting, diarrhea , she have abdominal pain.  GENITOURINARY: No dysuria, hematuria.  ENDOCRINE: No polyuria, nocturia,  HEMATOLOGY: No anemia, easy bruising or bleeding SKIN: No rash or lesion. MUSCULOSKELETAL: No joint pain or arthritis.   NEUROLOGIC: No tingling, numbness, weakness.  PSYCHIATRY: No anxiety or depression.   MEDICATIONS AT HOME:  Prior to Admission medications   Medication Sig Start Date End Date Taking? Authorizing Provider  hydrochlorothiazide (HYDRODIURIL) 25 MG tablet Take 25 mg by mouth daily.    [provider]  ipratropium (ATROVENT) 0.02 % nebulizer solution  04/20/15 05/04/15  [provider]  meloxicam (MOBIC) 7.5 MG tablet Take  7.5 mg by mouth as needed.     [provider]  montelukast (SINGULAIR) 10 MG tablet Take 1 tablet (10 mg total) by mouth at bedtime. 05/10/15   Wilhelmina Mcardle, MD      PHYSICAL EXAMINATION:   VITAL SIGNS: Blood pressure (!) 154/82, pulse 76, temperature 99.1 F (37.3 C), temperature source Oral, resp. rate 16, height 5\' 3"  (1.6 m), weight 77.1 kg, SpO2 90 %.  GENERAL:  72 y.o.-year-old patient lying in the bed with no acute distress.  EYES: Pupils equal, round, reactive to light and  accommodation. No scleral icterus. Extraocular muscles intact.  HEENT: Head atraumatic, normocephalic. Oropharynx and nasopharynx clear.  NECK:  Supple, no jugular venous distention. No thyroid enlargement, no tenderness.  LUNGS: Normal breath sounds bilaterally, no wheezing, rales,rhonchi or crepitation. No use of accessory muscles of respiration.  CARDIOVASCULAR: S1, S2 normal. No murmurs, rubs, or gallops.  ABDOMEN: Soft, left lower quadrant tender, nondistended. Bowel sounds present. No organomegaly or mass.  EXTREMITIES: No pedal edema, cyanosis, or clubbing.  NEUROLOGIC: Cranial nerves II through XII are intact. Muscle strength 5/5 in all extremities. Sensation intact. Gait not checked.  PSYCHIATRIC: The patient is alert and oriented x 3.  SKIN: No obvious rash, lesion, or ulcer.   LABORATORY PANEL:   CBC Recent Labs  Lab 11/18/17 1558  WBC 9.3  HGB 14.0  HCT 41.2  PLT 255  MCV 88.7  MCH 30.1  MCHC 33.9  RDW 14.4   ------------------------------------------------------------------------------------------------------------------  Chemistries  Recent Labs  Lab 11/18/17 1558  NA 138  K 3.7  CL 106  CO2 25  GLUCOSE 111*  BUN 10  CREATININE 0.77  CALCIUM 10.4*  AST 15  ALT 17  ALKPHOS 94  BILITOT 0.7   ------------------------------------------------------------------------------------------------------------------ estimated creatinine clearance is 64.4 mL/min (by C-G formula based on SCr of 0.77 mg/dL). ------------------------------------------------------------------------------------------------------------------ No results for input(s): TSH, T4TOTAL, T3FREE, THYROIDAB in the last 72 hours.  Invalid input(s): FREET3   Coagulation profile No results for input(s): INR, PROTIME in the last 168 hours. ------------------------------------------------------------------------------------------------------------------- No results for input(s): DDIMER in the last  72 hours. -------------------------------------------------------------------------------------------------------------------  Cardiac Enzymes No results for input(s): CKMB, TROPONINI, MYOGLOBIN in the last 168 hours.  Invalid input(s): CK ------------------------------------------------------------------------------------------------------------------ Invalid input(s): POCBNP  ---------------------------------------------------------------------------------------------------------------  Urinalysis    Component Value Date/Time   COLORURINE YELLOW (A) 11/18/2017 1558   APPEARANCEUR CLEAR (A) 11/18/2017 1558   LABSPEC 1.016 11/18/2017 1558   PHURINE 7.0 11/18/2017 1558   GLUCOSEU NEGATIVE 11/18/2017 1558   HGBUR NEGATIVE 11/18/2017 1558   BILIRUBINUR NEGATIVE 11/18/2017 1558   KETONESUR NEGATIVE 11/18/2017 1558   PROTEINUR NEGATIVE 11/18/2017 1558   NITRITE NEGATIVE 11/18/2017 1558   LEUKOCYTESUR NEGATIVE 11/18/2017 1558     RADIOLOGY: Dg Abdomen Acute W/chest  Result Date: 11/18/2017 CLINICAL DATA:  Sigmoid abscess EXAM: DG ABDOMEN ACUTE W/ 1V CHEST COMPARISON:  11/11/2010 FINDINGS: The heart is mildly enlarged. Lungs are under aerated with bibasilar atelectasis. Normal vascularity. There is no free intraperitoneal gas. There are no disproportionally dilated loops of bowel. Phleboliths project over the pelvis. Degenerative changes in the lower lumbar spine IMPRESSION: Bibasilar atelectasis Cardiomegaly Nonobstructive bowel gas pattern. Electronically Signed   By: Marybelle Killings M.D.   On: 11/18/2017 19:11    EKG: No orders found for this or any previous visit.  IMPRESSION AND PLAN:  *Sigmoid diverticulitis with abscess As per hospital protocol, admit to medical service and surgical team will take care of surgical issues. IV antibiotics. IV  fluids and liquid diet for now. Morphine for pain control for now. Patient does not appear to be septic.  *Hypertension Continue  hydrochlorothiazide.  *Asthma Continue ipratropium and montelukast as home meds.   All the records are reviewed and case discussed with ED provider. Management plans discussed with the patient, family and they are in agreement.  CODE STATUS: Full.   TOTAL TIME TAKING CARE OF THIS PATIENT: 45 minutes.    Vaughan Basta M.D on 11/18/2017   Between 7am to 6pm - Pager - [phone]  After 6pm go to [email or web address] - password [email or web address] [place] [facility]  Office  [phone]  CC: Primary care physician; Casilda Carls, MD (Inactive)   Note: This dictation was prepared with Dragon dictation along with smaller phrase technology. Any transcriptional errors that result from this process are unintentional.

## 2017-11-18 NOTE — Progress Notes (Signed)
Family Meeting Note  Advance Directive:no  Today a meeting took place with the Patient.  The following clinical team members were present during this meeting:MD  The following were discussed:Patient's diagnosis: Sigmoid diverticulitis, abscess , Patient's progosis: Unable to determine and Goals for treatment: Full Code  Pt told,in any adverse event- her son and daughter will make the decisions about her care.  Additional follow-up to be provided: Surgery  Time spent during discussion:20 minutes  Helen Basta, MD

## 2017-11-18 NOTE — ED Provider Notes (Addendum)
[facility] Emergency Department Provider Note    First MD Initiated Contact with Patient 11/18/17 1729     (approximate)  I have reviewed the triage vital signs and the nursing notes.   HISTORY  Chief Complaint Abdominal Pain    HPI Helen May is a 72 y.o. female resents to the ER for evaluation of persistent abdominal pain.  Patient just left AMA from [facility] yesterday where she was diagnosed with a sigmoid diverticular abscess 2.9cm in size.  She was given IV antibiotics and refused to stay where she was discharged home on oral antibiotics [place].  She talked to her primary care physician who encouraged her to return to the ER for IV antibiotics and admission.  She states the pain is currently moderate to severe.  Denies any allergies to any antibiotics.    Result Impression   --2.9 cm gas and fluid containing, rim-enhancing collection abutting the superior aspect of the sigmoid colon with adjacent inflammatory stranding, compatible with sigmoid diverticulitis with abscess formation.  --Additional incidental/chronic findings as above.     Past Medical History:  Diagnosis Date  . Arthritis   . Asthma   . Hyperlipidemia   . Hypertension   . Pneumonia    Family History  Problem Relation Age of Onset  . Cervical cancer Daughter   . Breast cancer Neg Hx    Past Surgical History:  Procedure Laterality Date  . Removal of Carotid Mass Right 15 years ago  . TUBAL LIGATION  1972   Patient Active Problem List   Diagnosis Date Noted  . Colonic diverticular abscess 11/18/2017  . Asthma due to environmental allergies 12/27/2013  . Hypertension 12/27/2013  . Left shoulder pain 12/27/2013  . Neck pain on left side 12/27/2013      Prior to Admission medications   Medication Sig Start Date End Date Taking? Authorizing Provider  hydrochlorothiazide (HYDRODIURIL) 25 MG tablet Take 25 mg by mouth daily.    [provider]  ipratropium (ATROVENT) 0.02 % nebulizer solution  04/20/15 05/04/15  [provider]  meloxicam (MOBIC) 7.5 MG tablet Take 7.5 mg by mouth as needed.     [provider]  montelukast (SINGULAIR) 10 MG tablet Take 1 tablet (10 mg total) by mouth at bedtime. 05/10/15   Wilhelmina Mcardle, MD    Allergies Patient has no known allergies.    Social History Social History   Tobacco Use  . Smoking status: Former Smoker    Years: 20.00    Last attempt to quit: 03/10/1993    Years since quitting: 24.7  . Smokeless tobacco: Never Used  Substance Use Topics  . Alcohol use: No    Alcohol/week: 0.0 standard drinks  . Drug use: No    Review of Systems Patient denies headaches, rhinorrhea, blurry vision, numbness, shortness of breath, chest pain, edema, cough, abdominal pain, nausea, vomiting, diarrhea, dysuria, fevers, rashes or hallucinations unless otherwise stated above in HPI. ____________________________________________   PHYSICAL EXAM:  VITAL SIGNS: Vitals:   11/18/17 1916 11/18/17 1930  BP: (!) 166/91 (!) 154/82  Pulse: 74 76  Resp:    Temp:    SpO2: 98% 90%    Constitutional: Alert and oriented.  Eyes: Conjunctivae are normal.  Head: Atraumatic. Nose: No congestion/rhinnorhea. Mouth/Throat: Mucous membranes are moist.   Neck: No stridor. Painless ROM.  Cardiovascular: Normal rate, regular rhythm. Grossly normal heart sounds.  Good peripheral circulation. Respiratory: Normal respiratory effort.  No  retractions. Lungs CTAB. Gastrointestinal: Soft with diffuse ttp. No distention. No abdominal bruits. No CVA tenderness. Genitourinary: deferred Musculoskeletal: No lower extremity tenderness nor edema.  No joint effusions. Neurologic:  Normal speech and language. No gross focal neurologic deficits are appreciated. No facial droop Skin:  Skin is warm, dry and intact. No rash noted. Psychiatric: Mood and affect are normal. Speech and behavior are  normal.  ____________________________________________   LABS (all labs ordered are listed, but only abnormal results are displayed)  Results for orders placed or performed during the hospital encounter of 11/18/17 (from the past 24 hour(s))  Lipase, blood     Status: None   Collection Time: 11/18/17  3:58 PM  Result Value Ref Range   Lipase 28 11 - 51 U/L  Comprehensive metabolic panel     Status: Abnormal   Collection Time: 11/18/17  3:58 PM  Result Value Ref Range   Sodium 138 135 - 145 mmol/L   Potassium 3.7 3.5 - 5.1 mmol/L   Chloride 106 98 - 111 mmol/L   CO2 25 22 - 32 mmol/L   Glucose, Bld 111 (H) 70 - 99 mg/dL   BUN 10 8 - 23 mg/dL   Creatinine, Ser 0.77 0.44 - 1.00 mg/dL   Calcium 10.4 (H) 8.9 - 10.3 mg/dL   Total Protein 7.9 6.5 - 8.1 g/dL   Albumin 3.8 3.5 - 5.0 g/dL   AST 15 15 - 41 U/L   ALT 17 0 - 44 U/L   Alkaline Phosphatase 94 38 - 126 U/L   Total Bilirubin 0.7 0.3 - 1.2 mg/dL   GFR calc non Af Amer >60 >60 mL/min   GFR calc Af Amer >60 >60 mL/min   Anion gap 7 5 - 15  CBC     Status: None   Collection Time: 11/18/17  3:58 PM  Result Value Ref Range   WBC 9.3 3.6 - 11.0 K/uL   RBC 4.64 3.80 - 5.20 MIL/uL   Hemoglobin 14.0 12.0 - 16.0 g/dL   HCT 41.2 35.0 - 47.0 %   MCV 88.7 80.0 - 100.0 fL   MCH 30.1 26.0 - 34.0 pg   MCHC 33.9 32.0 - 36.0 g/dL   RDW 14.4 11.5 - 14.5 %   Platelets 255 150 - 440 K/uL  Urinalysis, Complete w Microscopic     Status: Abnormal   Collection Time: 11/18/17  3:58 PM  Result Value Ref Range   Color, Urine YELLOW (A) YELLOW   APPearance CLEAR (A) CLEAR   Specific Gravity, Urine 1.016 1.005 - 1.030   pH 7.0 5.0 - 8.0   Glucose, UA NEGATIVE NEGATIVE mg/dL   Hgb urine dipstick NEGATIVE NEGATIVE   Bilirubin Urine NEGATIVE NEGATIVE   Ketones, ur NEGATIVE NEGATIVE mg/dL   Protein, ur NEGATIVE NEGATIVE mg/dL   Nitrite NEGATIVE NEGATIVE   Leukocytes, UA NEGATIVE NEGATIVE   RBC / HPF 0-5 0 - 5 RBC/hpf   WBC, UA 0-5 0 - 5  WBC/hpf   Bacteria, UA NONE SEEN NONE SEEN   Squamous Epithelial / LPF 0-5 0 - 5   Mucus PRESENT    ____________________________________________   ____________________________________________  RADIOLOGY  I personally reviewed all radiographic images ordered to evaluate for the above acute complaints and reviewed radiology reports and findings.  These findings were personally discussed with the patient.  Please see medical record for radiology report.  ____________________________________________   PROCEDURES  Procedure(s) performed:  Procedures    Critical Care performed: no ____________________________________________  INITIAL IMPRESSION / ASSESSMENT AND PLAN / ED COURSE  Pertinent labs & imaging results that were available during my care of the patient were reviewed by me and considered in my medical decision making (see chart for details).   DDX: diverticulitis, abscess, colitis, sepsis, dehydration  Helen May is a 72 y.o. who presents to the ED with recent diagnosis of sigmoid diverticular abscess.  Patient is afebrile.  Does have diffuse abdominal pain not.  Genetic at this point.  Blood will be sent for the above differential.  Results from [facility] listed above.  We will touch with surgery.  Will start broad-spectrum IV antibiotics.  Have made several attempts to contact Dr. Bary Castilla of general surgery, as this is reportedly who the PCP spoke with on the phone.  Will contact dr. Peyton Najjar on call at this time.  Clinical Course as of Nov 18 1949  Wed Nov 18, 2017  1910 Patient without white count.  Low-grade temperature.  Patient receiving IV pain medication.  Discussed case with Dr. Peyton Najjar of general surgery who agrees to follow patient.  Discussed case with hospitalist for continued IV antibiotics the patient will likely need IR drainage and further evaluation by surgery in the morning.  No focal peritonitis.  No free air on x-ray.  Doubt perforation.   [PR]     Clinical Course User Index [PR] Merlyn Lot, MD     As part of my medical decision making, I reviewed the following data within the [place] notes reviewed and incorporated, Labs reviewed, notes from prior ED visits.  ____________________________________________   FINAL CLINICAL IMPRESSION(S) / ED DIAGNOSES  Final diagnoses:  Diverticulitis of large intestine with abscess without bleeding      NEW MEDICATIONS STARTED DURING THIS VISIT:  New Prescriptions   No medications on file     Note:  This document was prepared using Dragon voice recognition software and may include unintentional dictation errors.    Merlyn Lot, MD 11/18/17 Hoy Register    Merlyn Lot, MD 11/18/17 [phone]

## 2017-11-18 NOTE — ED Notes (Signed)
This RN unable to obtain IV access. [place] IV access requested from another ED RN.

## 2017-11-18 NOTE — ED Notes (Signed)
Larene Beach, RN attempted IV access x1. Will attempt again s/p XR.

## 2017-11-18 NOTE — ED Triage Notes (Signed)
Pt to ED form Dr. Rosario Jacks office for abnor CT scan showing abscess to sigmoid colon. PT c/o abd pain xfew days, denies fevers. Pt appears uncomfortable, VSS, ambulatory

## 2017-11-18 NOTE — ED Notes (Signed)
Patient transported to XR. 

## 2017-11-19 ENCOUNTER — Inpatient Hospital Stay: Payer: Medicare Other

## 2017-11-19 DIAGNOSIS — K5720 Diverticulitis of large intestine with perforation and abscess without bleeding: Secondary | ICD-10-CM

## 2017-11-19 LAB — BASIC METABOLIC PANEL
Anion gap: 5 (ref 5–15)
BUN: 10 mg/dL (ref 8–23)
CALCIUM: 9.5 mg/dL (ref 8.9–10.3)
CO2: 26 mmol/L (ref 22–32)
CREATININE: 0.7 mg/dL (ref 0.44–1.00)
Chloride: 105 mmol/L (ref 98–111)
GFR calc Af Amer: >60 mL/min (ref >60)
GLUCOSE: 120 mg/dL — AB (ref 70–99)
Potassium: 3.7 mmol/L (ref 3.5–5.1)
Sodium: 136 mmol/L (ref 135–145)

## 2017-11-19 LAB — CBC
HCT: 37.7 % (ref 35.0–47.0)
Hemoglobin: 12.8 g/dL (ref 12.0–16.0)
MCH: 29.9 pg (ref 26.0–34.0)
MCHC: 33.9 g/dL (ref 32.0–36.0)
MCV: 88.3 fL (ref 80.0–100.0)
PLATELETS: 238 10*3/uL (ref 150–440)
RBC: 4.27 MIL/uL (ref 3.80–5.20)
RDW: 14.3 % (ref 11.5–14.5)
WBC: 9 10*3/uL (ref 3.6–11.0)

## 2017-11-19 MED ORDER — SODIUM CHLORIDE 0.9 % IV SOLN
INTRAVENOUS | Status: DC | PRN
  Administered 2017-11-19 – 2017-11-21 (×3): 250 mL via INTRAVENOUS

## 2017-11-19 MED ORDER — PIPERACILLIN-TAZOBACTAM 3.375 G IVPB
3.3750 g | Freq: Three times a day (TID) | INTRAVENOUS | Status: DC
  Administered 2017-11-19 – 2017-11-22 (×11): 3.375 g via INTRAVENOUS
  Filled 2017-11-19 (×11): qty 50

## 2017-11-19 MED ORDER — ACETAMINOPHEN 325 MG PO TABS
ORAL_TABLET | ORAL | Status: AC
  Administered 2017-11-19: 03:00:00
  Filled 2017-11-19: qty 2

## 2017-11-19 MED ORDER — IOPAMIDOL (ISOVUE-300) INJECTION 61%
15.0000 mL | INTRAVENOUS | Status: AC
  Administered 2017-11-19 (×2): 15 mL via ORAL

## 2017-11-19 NOTE — Progress Notes (Signed)
Patient seen and chart reviewed. Had discussed patient w/' Dr. Rosario Jacks yesterday by phone, reviewing [facility] records for him. Had recommended admission. Had discussed with him that I did not take call and that one of the other surgeons would see the patient. This message was apparently not clearly translated to the patient who expected me to assume care. Patient seen today as courtesy to outline recommendations for care, with the understanding that surgical care would be provided by one of my colleagues.  Full note to follow.  Would recommend IR drainage if possible, as area measures 3.6 cm on my review of CT.  May not be possible based on location.  Case discussed w/ Dr. Verdell Carmine.

## 2017-11-19 NOTE — Progress Notes (Signed)
Patient off the floor to CT, transported by orderly.

## 2017-11-19 NOTE — Consult Note (Signed)
Reason for Consult: Diverticular abscess Referring Physician: Abel Presto, MD Helen May is an 72 y.o. female.  HPI: This woman was in her normal health until 1 week ago.  At that time she developed some lower abdominal pain and bloating which she attributed to having had cereal with milk on 2 occasions.  She has a long history of lactose intolerance, but sometimes gives into the desire for milk expecting less than a day of lower abdominal bloating and cramping.  Her symptoms persisted over the next 2 days and worsened.  She subsequently had a family member take her to [facility] on Monday, September 9 and a CT scan was obtained in the early morning hours suggesting a diverticular abscess.  As it was anticipated she would benefit from percutaneous drainage, she was transferred to [facility].  She reports that she spent several hours in the emergency room before being admitted to the floor.  There was then some confusion as to when the procedure would be completed and the patient and her family became frustrated and she signed out AMA.  She did fill the prescriptions of Levaquin and Flagyl prescribed by the hospital staff, but had only 1 dose of medications post discharge on Wednesday, September 11.  Review of her hospital records at [facility] suggests she may have had 1 dose of IV antibiotics, perhaps 2 prior to her leaving AMA.  She contacted her primary physician who then called me for a recommendation.  I explained to him that I was no longer taking call, but I was able to review the computer records and suggested that she would benefit from ongoing IV antibiotics.  She was subsequently sent to the emergency department directly from home and admitted last night.  She has been started on IV antibiotics once again.  The patient reports she feels significantly better compared to what she did on September 10, although still having some lower abdominal discomfort.  She reports she normally moves her  bowels 1-2 times per day but has not had a bowel movement in about 3 days.  She reports no nausea and has been tolerating a liquid diet well.  Past Medical History:  Diagnosis Date  . Arthritis   . Asthma   . Hyperlipidemia   . Hypertension   . Pneumonia     Past Surgical History:  Procedure Laterality Date  . Removal of Carotid Mass Right 15 years ago  . TUBAL LIGATION  1972    Family History  Problem Relation Age of Onset  . Cervical cancer Daughter   . Breast cancer Neg Hx     Social History:  reports that she quit smoking about 24 years ago. She quit after 20.00 years of use. She has never used smokeless tobacco. She reports that she does not drink alcohol or use drugs.  Allergies: No Known Allergies  Medications: I have reviewed the patient's current medications.  Results for orders placed or performed during the hospital encounter of 11/18/17 (from the past 48 hour(s))  Lipase, blood     Status: None   Collection Time: 11/18/17  3:58 PM  Result Value Ref Range   Lipase 28 11 - 51 U/L    Comment: Performed at [facility], [address] [place], [place] [address]  Comprehensive metabolic panel     Status: Abnormal   Collection Time: 11/18/17  3:58 PM  Result Value Ref Range   Sodium 138 135 - 145 mmol/L   Potassium 3.7 3.5 -  5.1 mmol/L   Chloride 106 98 - 111 mmol/L   CO2 25 22 - 32 mmol/L   Glucose, Bld 111 (H) 70 - 99 mg/dL   BUN 10 8 - 23 mg/dL   Creatinine, Ser 0.77 0.44 - 1.00 mg/dL   Calcium 10.4 (H) 8.9 - 10.3 mg/dL   Total Protein 7.9 6.5 - 8.1 g/dL   Albumin 3.8 3.5 - 5.0 g/dL   AST 15 15 - 41 U/L   ALT 17 0 - 44 U/L   Alkaline Phosphatase 94 38 - 126 U/L   Total Bilirubin 0.7 0.3 - 1.2 mg/dL   GFR calc non Af Amer >60 >60 mL/min   GFR calc Af Amer >60 >60 mL/min    Comment: (NOTE) The eGFR has been calculated using the CKD EPI equation. This calculation has not been validated in all clinical situations. eGFR's persistently <60 mL/min  signify possible Chronic Kidney Disease.    Anion gap 7 5 - 15    Comment: Performed at [facility], [address] [place], [place] [address]  CBC     Status: None   Collection Time: 11/18/17  3:58 PM  Result Value Ref Range   WBC 9.3 3.6 - 11.0 K/uL   RBC 4.64 3.80 - 5.20 MIL/uL   Hemoglobin 14.0 12.0 - 16.0 g/dL   HCT 41.2 35.0 - 47.0 %   MCV 88.7 80.0 - 100.0 fL   MCH 30.1 26.0 - 34.0 pg   MCHC 33.9 32.0 - 36.0 g/dL   RDW 14.4 11.5 - 14.5 %   Platelets 255 150 - 440 K/uL    Comment: Performed at [facility], [address] [place], [place] [address]  Urinalysis, Complete w Microscopic     Status: Abnormal   Collection Time: 11/18/17  3:58 PM  Result Value Ref Range   Color, Urine YELLOW (A) YELLOW   APPearance CLEAR (A) CLEAR   Specific Gravity, Urine 1.016 1.005 - 1.030   pH 7.0 5.0 - 8.0   Glucose, UA NEGATIVE NEGATIVE mg/dL   Hgb urine dipstick NEGATIVE NEGATIVE   Bilirubin Urine NEGATIVE NEGATIVE   Ketones, ur NEGATIVE NEGATIVE mg/dL   Protein, ur NEGATIVE NEGATIVE mg/dL   Nitrite NEGATIVE NEGATIVE   Leukocytes, UA NEGATIVE NEGATIVE   RBC / HPF 0-5 0 - 5 RBC/hpf   WBC, UA 0-5 0 - 5 WBC/hpf   Bacteria, UA NONE SEEN NONE SEEN   Squamous Epithelial / LPF 0-5 0 - 5   Mucus PRESENT     Comment: Performed at [facility], [address] [place],  [address]  Basic metabolic panel     Status: Abnormal   Collection Time: 11/19/17  4:36 AM  Result Value Ref Range   Sodium 136 135 - 145 mmol/L   Potassium 3.7 3.5 - 5.1 mmol/L   Chloride 105 98 - 111 mmol/L   CO2 26 22 - 32 mmol/L   Glucose, Bld 120 (H) 70 - 99 mg/dL   BUN 10 8 - 23 mg/dL   Creatinine, Ser 0.70 0.44 - 1.00 mg/dL   Calcium 9.5 8.9 - 10.3 mg/dL   GFR calc non Af Amer >60 >60 mL/min   GFR calc Af Amer >60 >60 mL/min    Comment: (NOTE) The eGFR has been calculated using the CKD EPI equation. This calculation has not been validated in all clinical  situations. eGFR's persistently <60 mL/min signify possible Chronic Kidney Disease.    Anion gap 5 5 -  15    Comment: Performed at [facility], [address] [place], [place] [address]  CBC     Status: None   Collection Time: 11/19/17  4:36 AM  Result Value Ref Range   WBC 9.0 3.6 - 11.0 K/uL   RBC 4.27 3.80 - 5.20 MIL/uL   Hemoglobin 12.8 12.0 - 16.0 g/dL   HCT 37.7 35.0 - 47.0 %   MCV 88.3 80.0 - 100.0 fL   MCH 29.9 26.0 - 34.0 pg   MCHC 33.9 32.0 - 36.0 g/dL   RDW 14.3 11.5 - 14.5 %   Platelets 238 150 - 440 K/uL    Comment: Performed at [facility], [address] [place], [place] [address]    Dg Abdomen Acute W/chest  Result Date: 11/18/2017 CLINICAL DATA:  Sigmoid abscess EXAM: DG ABDOMEN ACUTE W/ 1V CHEST COMPARISON:  11/11/2010 FINDINGS: The heart is mildly enlarged. Lungs are under aerated with bibasilar atelectasis. Normal vascularity. There is no free intraperitoneal gas. There are no disproportionally dilated loops of bowel. Phleboliths project over the pelvis. Degenerative changes in the lower lumbar spine IMPRESSION: Bibasilar atelectasis Cardiomegaly Nonobstructive bowel gas pattern. Electronically Signed   By: Marybelle Killings M.D.   On: 11/18/2017 19:11    ROS Blood pressure (!) 161/83, pulse 74, temperature 98 F (36.7 C), temperature source Oral, resp. rate 16, height 5' 3"  (1.6 m), weight 77.1 kg, SpO2 99 %. Physical Exam Examination showed the patient to be awake and alert and comfortable.  Head neck exam was unremarkable.  Chest exam was clear.  Cardiac exam showed a regular rhythm without murmur gallop.  The abdomen was not distended.  No focal tenderness or mass-effect.  Femoral pulses were intact as were DP and PT pulses.  No peripheral edema.   Assessment/Plan: Her November 17, 2017 CT completed at [facility] was available for review in my office.  While the summary reports a 2.8 cm abscess at the base of the sigmoid mesentery in  actuality this is a 3.6 x 3.8 cm abscess with an air-fluid level.  Evidence of diverticulitis is noted.  Laboratory studies at the time of her admission at [facility] showed a normal white blood cell count with minimal left shift.  Improved since her hospitalization here.  The abscesses of the size that would benefit from percutaneous drainage if possible.  CT images are not available to [facility] radiologist, but have been requested from [facility].  The patient is amenable to percutaneous drainage if it is possible.  At this time, I do not anticipate that the patient will require surgical intervention.  This certainly could change based on the results of the upcoming radiologic intervention.  Forest Gleason Cydney Alvarenga 11/19/2017, 7:53 PM

## 2017-11-19 NOTE — Consult Note (Signed)
SURGICAL CONSULTATION NOTE   HISTORY OF PRESENT ILLNESS (HPI):  72 y.o. female presented to [facility] ED for evaluation of diverticular disease with abscess. Patient reports she started with abdominal pain described as burning on the mid lower abdomen since 4-5 days ago. The pain radiates to the left lower quadrant. Pain pain was exacerbated by cereal she ate that day. Pain progressively got worse and she tried to see her primary care physician but was not able to and decided to go to [facility] for evaluation. The patient had a CT scan that showed acute diverticulitis with small peri colonic abscess. It was recommended to have percutaneous drainage but since patient had to wait in the ER for a long time she called her primary care who told her to see him. Patient refers that Primary care told her to go the [facility] for admission and that show will discuss case with Dr. Bary Castilla.  Patient refers that pain has improved with the pain medications received and antibiotic therapy. Denies fever or diarrhea.  Upon questioning about last colonoscopy patient does not remember the year but refers was "couple of years ago". She does not remember the provider, she thinks that was "Dr. Pat Patrick at [facility]". Upon chart review I did not found any docummentation from Dr. Pat Patrick. Only documentation about colonoscopy is about a cancelled colonoscopy appt on 2017 by Dr. Vicente Males. I was not able to find any documentation if that colonoscopy was done or rescheduled, but patient refers she had a colonoscopy done at [facility], and she was told it was fine.  Patient refers a previous episodes of diverticulitis treated with antibiotic. Does not remember anybody mentioning her about abscess or complications on that episodes.   Surgery is consulted by Dr. Boyce Medici in this context for evaluation and management of diverticulitis with abscess.  PAST MEDICAL HISTORY (PMH):  Past Medical History:  Diagnosis Date  . Arthritis   . Asthma   . Hyperlipidemia   .  Hypertension   . Pneumonia      PAST SURGICAL HISTORY [facility]):  Past Surgical History:  Procedure Laterality Date  . Removal of Carotid Mass Right 15 years ago  . TUBAL LIGATION  1972     MEDICATIONS:  Prior to Admission medications   Medication Sig Start Date End Date Taking? Authorizing Provider  hydrochlorothiazide (HYDRODIURIL) 25 MG tablet Take 25 mg by mouth daily.    [provider]  ipratropium (ATROVENT) 0.02 % nebulizer solution  04/20/15 05/04/15  [provider]  meloxicam (MOBIC) 7.5 MG tablet Take 7.5 mg by mouth as needed.     [provider]  montelukast (SINGULAIR) 10 MG tablet Take 1 tablet (10 mg total) by mouth at bedtime. 05/10/15   Wilhelmina Mcardle, MD     ALLERGIES:  No Known Allergies   SOCIAL HISTORY:  Social History   Socioeconomic History  . Marital status: Widowed    Spouse name: Not on file  . Number of children: Not on file  . Years of education: Not on file  . Highest education level: Not on file  Occupational History  . Not on file  Social Needs  . Financial resource strain: Not on file  . Food insecurity:    Worry: Not on file    Inability: Not on file  . Transportation needs:    Medical: Not on file    Non-medical: Not on file  Tobacco Use  . Smoking status: Former Smoker    Years: 20.00  Last attempt to quit: 03/10/1993    Years since quitting: 24.7  . Smokeless tobacco: Never Used  Substance and Sexual Activity  . Alcohol use: No    Alcohol/week: 0.0 standard drinks  . Drug use: No  . Sexual activity: Not on file  Lifestyle  . Physical activity:    Days per week: Not on file    Minutes per session: Not on file  . Stress: Not on file  Relationships  . Social connections:    Talks on phone: Not on file    Gets together: Not on file    Attends religious service: Not on file    Active member of club or organization: Not on file    Attends meetings of clubs or organizations: Not on file     Relationship status: Not on file  . Intimate partner violence:    Fear of current or ex partner: Not on file    Emotionally abused: Not on file    Physically abused: Not on file    Forced sexual activity: Not on file  Other Topics Concern  . Not on file  Social History Narrative  . Not on file    The patient currently resides (home / rehab facility / nursing home): Home The patient normally is (ambulatory / bedbound): Ambulatory   FAMILY HISTORY:  Family History  Problem Relation Age of Onset  . Cervical cancer Daughter   . Breast cancer Neg Hx      REVIEW OF SYSTEMS:  Constitutional: denies weight loss, fever, chills, or sweats  Eyes: denies any other vision changes, history of eye injury  ENT: denies sore throat, hearing problems  Respiratory: denies shortness of breath, wheezing  Cardiovascular: denies chest pain, palpitations  Gastrointestinal: positive for abdominal pain, No nausea or vomiting, or diarrhea Genitourinary: denies burning with urination or urinary frequency Musculoskeletal: denies any other joint pains or cramps  Skin: denies any other rashes or skin discolorations  Neurological: denies any other headache, dizziness, weakness  Psychiatric: denies any other depression, anxiety   All other review of systems were negative   VITAL SIGNS:  Temp:  [98 F (36.7 C)-99.1 F (37.3 C)] 98 F (36.7 C) (09/12 0531) Pulse Rate:  [65-91] 73 (09/12 0531) Resp:  [16-20] 16 (09/12 0531) BP: (144-193)/(82-110) 144/83 (09/12 0531) SpO2:  [90 %-100 %] 99 % (09/12 0531) Weight:  [77.1 kg] 77.1 kg (09/11 1557)     Height: 5\' 3"  (160 cm) Weight: 77.1 kg BMI (Calculated): 30.12   INTAKE/OUTPUT:  This shift: No intake/output data recorded.  Last 2 shifts: @IOLAST2SHIFTS @   PHYSICAL EXAM:  Constitutional:  -- Normal body habitus  -- Awake, alert, and oriented x3  Eyes:  -- Pupils equally round and reactive to light  -- No scleral icterus  Ear, nose, and throat:  --  No jugular venous distension  Pulmonary:  -- No crackles  -- Equal breath sounds bilaterally -- Breathing non-labored at rest Cardiovascular:  -- S1, S2 present  -- No pericardial rubs Gastrointestinal:  -- Abdomen soft, mild tender on left lower quadrant, non-distended, no guarding or rebound tenderness -- No abdominal masses appreciated, pulsatile or otherwise  Musculoskeletal and Integumentary:  -- Wounds or skin discoloration: None appreciated -- Extremities: B/L UE and LE FROM, hands and feet warm, no edema  Neurologic:  -- Motor function: intact and symmetric -- Sensation: intact and symmetric   Labs:  CBC Latest Ref Rng & Units 11/19/2017 11/18/2017 01/11/2013  WBC 3.6 - 11.0  K/uL 9.0 9.3 9.5  Hemoglobin 12.0 - 16.0 g/dL 12.8 14.0 14.4  Hematocrit 35.0 - 47.0 % 37.7 41.2 42.4  Platelets 150 - 440 K/uL 238 255 260   CMP Latest Ref Rng & Units 11/19/2017 11/18/2017 01/11/2013  Glucose 70 - 99 mg/dL 120(H) 111(H) 89  BUN 8 - 23 mg/dL 10 10 8   Creatinine 0.44 - 1.00 mg/dL 0.70 0.77 0.88  Sodium 135 - 145 mmol/L 136 138 137  Potassium 3.5 - 5.1 mmol/L 3.7 3.7 3.5  Chloride 98 - 111 mmol/L 105 106 104  CO2 22 - 32 mmol/L 26 25 31   Calcium 8.9 - 10.3 mg/dL 9.5 10.4(H) 10.1  Total Protein 6.5 - 8.1 g/dL - 7.9 8.3(H)  Total Bilirubin 0.3 - 1.2 mg/dL - 0.7 0.7  Alkaline Phos 38 - 126 U/L - 94 143(H)  AST 15 - 41 U/L - 15 16  ALT 0 - 44 U/L - 17 19   Imaging studies: CT scan report from [facility], no images available.  CLINICAL INDICATION: ABDOMINAL PAIN, (specify site in comments) -  periumbilical -  radiates to back    COMPARISON: CT abdomen pelvis 06/01/2007  TECHNIQUE: A spiral CT scan of the abdomen and pelvis was obtained with IV contrast from the lung bases through the pubic symphysis. Images were reconstructed in the axial plane. Coronal and sagittal reformatted images were also provided for further evaluation.  FINDINGS:   LOWER THORAX: Mild dependent subsegmental  atelectasis.Marland Kitchen  HEPATOBILIARY: 1.2 cm hypodensity in the left lobe of the liver (2:38), likely a cyst. Focal fat deposition adjacent to the falciform ligament. The gallbladder is mildly distended. No gallbladder wall thickening. No pericholecystic fluid. No biliary dilatation.   SPLEEN: Unremarkable. PANCREAS: Unremarkable.  ADRENALS: Mild nodular thickening of the bilateral adrenal glands. KIDNEYS/URETERS: Mild bilateral perinephric stranding. No hydronephrosis.  BLADDER: Unremarkable. PELVIC/REPRODUCTIVE ORGANS: The uterus is unremarkable. No adnexal masses.  GI TRACT: Sigmoid colonic diverticulosis. Abutting and superior to the sigmoid colon, there is a 3.9 x 3.8 x 3.7 cm rim-enhancing gas and fluid containing collection (2:81, 4:46), with adjacent inflammatory stranding. The appendix is unremarkable.  PERITONEUM/RETROPERITONEUM AND MESENTERY: No free air or fluid. LYMPH NODES: No enlarged lymph nodes.  VESSELS: The aorta is normal in caliber.  No significant calcified atherosclerotic disease. The portal venous system is patent. The hepatic veins and IVC are unremarkable.  BONES AND SOFT TISSUES: Multilevel degenerative changes of the visualized spine. No acute osseous abnormality. Small right fat-containing inguinal hernia. Small fat-containing umbilical hernia.   IMPRESSION:  --2.9 cm gas and fluid containing, rim-enhancing collection abutting the superior aspect of the sigmoid colon with adjacent inflammatory stranding, compatible with sigmoid diverticulitis with abscess formation.  --Additional incidental/chronic findings as above.  Assessment/Plan:  72 y.o. female with acute diverticulitis with pericolonic abscess, complicated by pertinent comorbidities including asthma, hypertension and hyperlipidemia. Patient with history, physical exam and imaging consistent with acute diverticulitis with pericolonic abscess. Upon introduction of myself as the surgeon on call she refers that  she does not want to talk about surgery. Patient also questioned if I am Dr. Bary Castilla since she was told by primary care that Dr. Bary Castilla was going to evaluate her case. I told her that I am the surgeon on call and that I was not going to talk about surgery at this moment but to evaluate her before taking any decision for surgery.  The patient was oriented about the diagnosis of diverticulitis with abscess and the recommendation of percutaneous drainage. Patient  also oriented that most patient improve from the acute phase without any surgery with the treatment of drainage and antibiotic therapy but at some point surgery will be recommended after healing.  Patient also questioned and refers that she does not want a colostomy. I told the patient that at this moment since she does not need surgery, a colostomy is not being considered but if she does not improve with drainage and antibiotic and surgery is needed in this admission, she will need a colostomy. Also I explained the patient that after healing elective surgery is recommended and even the chances of needing a colostomy are low, I can not guarantee that she will not need an ostomy at all. She again refused saying that she does not want a colostomy.  I discussed the case with Dr. Bary Castilla and refers that he will follow patient during this admission.  Arnold Long, MD

## 2017-11-19 NOTE — Consult Note (Addendum)
SURGICAL CONSULTATION NOTE (initial) - cpt: 28315   *Patient Originally seen at 10:00am 11/19/17, note deleted in error*   HISTORY OF PRESENT ILLNESS (HPI):  72 y.o. female presented to [facility] ED on 11/18/17 for evaluation of abdominal pain. Patient reports 4-5 days of diffuse lower abdominal pain that radiated to her back. The pain was cramping in nature and constant since the onset. She denied any associated fevers, chills, nausea,. Emesis, or bowel changes. She did note a history of similar pain about 3 years ago in which she was diagnosed with diverticulitis. She went to [facility] on 11/17/17 for this pain and was diagnosed with diverticulitis with a 2.9 cm abscess by CT imaging. However, she left AMA because the wait was too long and presented here for evaluation and management.  Surgery is consulted by hospitalist physician Dr. Anselm Jungling, MD in this context for evaluation and management of complicated diverticulitis with abscess.     PAST MEDICAL HISTORY (PMH):      Past Medical History:  Diagnosis Date  . Arthritis   . Asthma   . Hyperlipidemia   . Hypertension   . Pneumonia      PAST SURGICAL HISTORY [facility]):       Past Surgical History:  Procedure Laterality Date  . Removal of Carotid Mass Right 15 years ago  . TUBAL LIGATION  1972     MEDICATIONS:         Prior to Admission medications   Medication Sig Start Date End Date Taking? Authorizing Provider  hydrochlorothiazide (HYDRODIURIL) 25 MG tablet Take 25 mg by mouth daily.    [provider]  ipratropium (ATROVENT) 0.02 % nebulizer solution  04/20/15 05/04/15  [provider]  meloxicam (MOBIC) 7.5 MG tablet Take 7.5 mg by mouth as needed.     [provider]  montelukast (SINGULAIR) 10 MG tablet Take 1 tablet (10 mg total) by mouth at bedtime. 05/10/15   Wilhelmina Mcardle, MD     ALLERGIES:  No Known Allergies   SOCIAL HISTORY:  Social History         Socioeconomic History  . Marital status: Widowed    Spouse name: Not on file  . Number of children: Not on file  . Years of education: Not on file  . Highest education level: Not on file  Occupational History  . Not on file  Social Needs  . Financial resource strain: Not on file  . Food insecurity:    Worry: Not on file    Inability: Not on file  . Transportation needs:    Medical: Not on file    Non-medical: Not on file  Tobacco Use  . Smoking status: Former Smoker    Years: 20.00    Last attempt to quit: 03/10/1993    Years since quitting: 24.7  . Smokeless tobacco: Never Used  Substance and Sexual Activity  . Alcohol use: No    Alcohol/week: 0.0 standard drinks  . Drug use: No  . Sexual activity: Not on file  Lifestyle  . Physical activity:    Days per week: Not on file    Minutes per session: Not on file  . Stress: Not on file  Relationships  . Social connections:    Talks on phone: Not on file    Gets together: Not on file    Attends religious service: Not on file    Active member of club or organization: Not on file    Attends meetings of  clubs or organizations: Not on file    Relationship status: Not on file  . Intimate partner violence:    Fear of current or ex partner: Not on file    Emotionally abused: Not on file    Physically abused: Not on file    Forced sexual activity: Not on file  Other Topics Concern  . Not on file  Social History Narrative  . Not on file    The patient currently resides (home / rehab facility / nursing home): Home The patient normally is (ambulatory / bedbound): Ambulatory   FAMILY HISTORY:       Family History  Problem Relation Age of Onset  . Cervical cancer Daughter   . Breast cancer Neg Hx      REVIEW OF SYSTEMS:  Constitutional: denies weight loss, fever, chills, or sweats  Eyes: denies any other vision changes, history of eye injury  ENT: denies sore throat, hearing  problems  Respiratory: denies shortness of breath, wheezing  Cardiovascular: denies chest pain, palpitations  Gastrointestinal:+ abdominal pain, N/V, or diarrhea/and bowel function as per HPI Genitourinary: denies burning with urination or urinary frequency Musculoskeletal: denies any other joint pains or cramps  Skin: denies any other rashes or skin discolorations  Neurological: denies any other headache, dizziness, weakness  Psychiatric: denies any other depression, anxiety   All other review of systems were negative   VITAL SIGNS:  Temp:  [98 F (36.7 C)-99.1 F (37.3 C)] 98 F (36.7 C) (09/12 0531) Pulse Rate:  [65-91] 73 (09/12 0531) Resp:  [16-20] 16 (09/12 0531) BP: (144-193)/(82-110) 144/83 (09/12 0531) SpO2:  [90 %-100 %] 99 % (09/12 0531) Weight:  [77.1 kg] 77.1 kg (09/11 1557)     Height: 5\' 3"  (160 cm) Weight: 77.1 kg BMI (Calculated): 30.12   INTAKE/OUTPUT:  This shift: No intake/output data recorded.  Last 2 shifts: @IOLAST2SHIFTS @   PHYSICAL EXAM:  Constitutional:  -- Obese body habitus  -- Awake, alert, and oriented x3, no apparent distress Eyes:  -- Pupils equally round and reactive to light  -- No scleral icterus, B/L no occular discharge Ear, nose, throat: -- Neck is FROM WNL Pulmonary:  -- No wheezes or rhales -- Equal breath sounds bilaterally -- Breathing non-labored at rest Cardiovascular:  -- S1, S2 present  -- No pericardial rubs  Gastrointestinal:  -- Abdomen soft, minimal LLQ tenderness, non-distended, no guarding or rebound tenderness -- No abdominal masses appreciated, pulsatile or otherwise  Musculoskeletal and Integumentary: -- Extremities: B/L UE and LE FROM, hands and feet warm, no edema  Neurologic:  -- Motor function: Intact and symmetric -- Sensation: Intact and symmetric Psychiatric:  -- Mood and affect WNL                Labs:  CBC Latest Ref Rng & Units 11/19/2017 11/18/2017 01/11/2013  WBC 3.6 - 11.0 K/uL 9.0 9.3 9.5   Hemoglobin 12.0 - 16.0 g/dL 12.8 14.0 14.4  Hematocrit 35.0 - 47.0 % 37.7 41.2 42.4  Platelets 150 - 440 K/uL 238 255 260   CMP Latest Ref Rng & Units 11/19/2017 11/18/2017 01/11/2013  Glucose 70 - 99 mg/dL 120(H) 111(H) 89  BUN 8 - 23 mg/dL 10 10 8   Creatinine 0.44 - 1.00 mg/dL 0.70 0.77 0.88  Sodium 135 - 145 mmol/L 136 138 137  Potassium 3.5 - 5.1 mmol/L 3.7 3.7 3.5  Chloride 98 - 111 mmol/L 105 106 104  CO2 22 - 32 mmol/L 26 25 31   Calcium 8.9 -  10.3 mg/dL 9.5 10.4(H) 10.1  Total Protein 6.5 - 8.1 g/dL - 7.9 8.3(H)  Total Bilirubin 0.3 - 1.2 mg/dL - 0.7 0.7  Alkaline Phos 38 - 126 U/L - 94 143(H)  AST 15 - 41 U/L - 15 16  ALT 0 - 44 U/L - 17 19    Imaging studies:  CT abdomen/Pelvis from [facility] on 11/17/17:   --2.9 cm gas and fluid containing, rim-enhancing collection abutting the superior aspect of the sigmoid colon with adjacent inflammatory stranding, compatible with sigmoid diverticulitis with abscess formation.  --Additional incidental/chronic findings as above.  Assessment/Plan: (ICD-10's: K70.32) 72 y.o. female with complicated diverticulitis with abscess without perforation, complicated by pertinent comorbidities including obesity, HTN, HLD, Former smoker.              - She is showing some clinical improvement this morning with improvement in her abdominal pain, continue serial abdominal exams              - Given improvement, will discuss with radiologist about possible IR drainage however, will hold off and treat with ABx for now. If she were to worsen tomorrow, will consider re-imaging. Appreciate Dr. Dwyane Luo involvement with CT images from [facility]             - Will start on IV Zosyn given complicated diverticulitis             - Continue liquid diet as tolerated             - Mobilize              - DVT prophylaxis             - Additional management per medicine team.   All of the above findings and recommendations were discussed with the patient, and all of  patient's questions were answered to her expressed satisfaction.  Thank you for the opportunity to participate in this patient's care.   -- Edison Simon, PA-C [facility] 11/19/2017, 2:41 PM  [phone] M-F: 7am - 4pm

## 2017-11-19 NOTE — Progress Notes (Signed)
[place] at [place] NAME: Helen May    MR#:  [id number]  DATE OF BIRTH:  1945/11/30  SUBJECTIVE:   Patient presents to the hospital due to abdominal pain and noted to have acute diverticulitis with a diverticular abscess.  Still having some abdominal pain but no fever chills or nausea vomiting.  Patient's family is at bedside.  REVIEW OF SYSTEMS:    Review of Systems  Constitutional: Negative for chills and fever.  HENT: Negative for congestion and tinnitus.   Eyes: Negative for blurred vision and double vision.  Respiratory: Negative for cough, shortness of breath and wheezing.   Cardiovascular: Negative for chest pain, orthopnea and PND.  Gastrointestinal: Positive for abdominal pain. Negative for diarrhea, nausea and vomiting.  Genitourinary: Negative for dysuria and hematuria.  Neurological: Negative for dizziness, sensory change and focal weakness.  All other systems reviewed and are negative.   Nutrition: Full liquid Tolerating Diet: yes but little Tolerating PT: Ambulatory   DRUG ALLERGIES:  No Known Allergies  VITALS:  Blood pressure (!) 161/83, pulse 74, temperature 98 F (36.7 C), temperature source Oral, resp. rate 16, height 5\' 3"  (1.6 m), weight 77.1 kg, SpO2 99 %.  PHYSICAL EXAMINATION:   Physical Exam  GENERAL:  72 y.o.-year-old patient lying in bed in no acute distress.  EYES: Pupils equal, round, reactive to light and accommodation. No scleral icterus. Extraocular muscles intact.  HEENT: Head atraumatic, normocephalic. Oropharynx and nasopharynx clear.  NECK:  Supple, no jugular venous distention. No thyroid enlargement, no tenderness.  LUNGS: Normal breath sounds bilaterally, no wheezing, rales, rhonchi. No use of accessory muscles of respiration.  CARDIOVASCULAR: S1, S2 normal. No murmurs, rubs, or gallops.  ABDOMEN: Soft, nontender, nondistended. Bowel sounds present. No organomegaly or mass.   EXTREMITIES: No cyanosis, clubbing or edema b/l.    NEUROLOGIC: Cranial nerves II through XII are intact. No focal Motor or sensory deficits b/l.   PSYCHIATRIC: The patient is alert and oriented x 3.  SKIN: No obvious rash, lesion, or ulcer.    LABORATORY PANEL:   CBC Recent Labs  Lab 11/19/17 0436  WBC 9.0  HGB 12.8  HCT 37.7  PLT 238   ------------------------------------------------------------------------------------------------------------------  Chemistries  Recent Labs  Lab 11/18/17 1558 11/19/17 0436  NA 138 136  K 3.7 3.7  CL 106 105  CO2 25 26  GLUCOSE 111* 120*  BUN 10 10  CREATININE 0.77 0.70  CALCIUM 10.4* 9.5  AST 15  --   ALT 17  --   ALKPHOS 94  --   BILITOT 0.7  --    ------------------------------------------------------------------------------------------------------------------  Cardiac Enzymes No results for input(s): TROPONINI in the last 168 hours. ------------------------------------------------------------------------------------------------------------------  RADIOLOGY:  Dg Abdomen Acute W/chest  Result Date: 11/18/2017 CLINICAL DATA:  Sigmoid abscess EXAM: DG ABDOMEN ACUTE W/ 1V CHEST COMPARISON:  11/11/2010 FINDINGS: The heart is mildly enlarged. Lungs are under aerated with bibasilar atelectasis. Normal vascularity. There is no free intraperitoneal gas. There are no disproportionally dilated loops of bowel. Phleboliths project over the pelvis. Degenerative changes in the lower lumbar spine IMPRESSION: Bibasilar atelectasis Cardiomegaly Nonobstructive bowel gas pattern. Electronically Signed   By: Marybelle Killings M.D.   On: 11/18/2017 19:11     ASSESSMENT AND PLAN:   72 year old female with past medical history of hypertension, hyperlipidemia, asthma, osteoarthritis who presented to the hospital due to abdominal pain and noted to have acute diverticulitis with abscess formation.  1.  Acute diverticulitis with  abscess- this was initially  diagnosed based on a CT done at [facility].  She was not satisfied with her care there and therefore presented to the ER here. - Continue antibiotics with IV Zosyn for now.  Seen by general surgery and they think that the patient may benefit from IR guided percutaneous drainage of the diverticular abscess.  Await further input from interventional radiology.  Continue current care with IV fluids, antiemetics, pain control and IV antibiotics.  2.  Essential hypertension-continue hydrochlorothiazide.  Discussed plan of care with Dr. Bary Castilla.    All the records are reviewed and case discussed with Care Management/Social Worker. Management plans discussed with the patient, family and they are in agreement.  CODE STATUS: Full code  DVT Prophylaxis: Hep SQ  TOTAL TIME TAKING CARE OF THIS PATIENT: 35 minutes.   POSSIBLE D/C IN 2-3 DAYS, DEPENDING ON CLINICAL CONDITION.   Henreitta Leber M.D on 11/19/2017 at 2:59 PM  Between 7am to 6pm - Pager - [phone]  After 6pm go to [email or web address] - Proofreader  [facility]  Office  [phone]  CC: Primary care physician; Casilda Carls, MD (Inactive)

## 2017-11-19 NOTE — Consult Note (Deleted)
SURGICAL CONSULTATION NOTE (initial) - cpt: 85462  HISTORY OF PRESENT ILLNESS (HPI):  72 y.o. female presented to [facility] ED on 11/18/17 for evaluation of abdominal pain. Patient reports 4-5 days of diffuse lower abdominal pain that radiated to her back. The pain was cramping in nature and constant since the onset. She denied any associated fevers, chills, nausea,. Emesis, or bowel changes. She did note a history of similar pain about 3 years ago in which she was diagnosed with diverticulitis. She went to [facility] on 11/17/17 for this pain and was diagnosed with diverticulitis with a 2.9 cm abscess by CT imaging. However, she left AMA because the wait was too long and presented here for evaluation and management.  Surgery is consulted by hospitalist physician Dr. Anselm Jungling, MD in this context for evaluation and management of complicated diverticulitis with abscess.   PAST MEDICAL HISTORY (PMH):  Past Medical History:  Diagnosis Date  . Arthritis   . Asthma   . Hyperlipidemia   . Hypertension   . Pneumonia      PAST SURGICAL HISTORY [facility]):  Past Surgical History:  Procedure Laterality Date  . Removal of Carotid Mass Right 15 years ago  . TUBAL LIGATION  1972     MEDICATIONS:  Prior to Admission medications   Medication Sig Start Date End Date Taking? Authorizing Provider  hydrochlorothiazide (HYDRODIURIL) 25 MG tablet Take 25 mg by mouth daily.    [provider]  ipratropium (ATROVENT) 0.02 % nebulizer solution  04/20/15 05/04/15  [provider]  meloxicam (MOBIC) 7.5 MG tablet Take 7.5 mg by mouth as needed.     [provider]  montelukast (SINGULAIR) 10 MG tablet Take 1 tablet (10 mg total) by mouth at bedtime. 05/10/15   Wilhelmina Mcardle, MD     ALLERGIES:  No Known Allergies   SOCIAL HISTORY:  Social History   Socioeconomic History  . Marital status: Widowed    Spouse name: Not on file  . Number of children: Not on file  . Years of  education: Not on file  . Highest education level: Not on file  Occupational History  . Not on file  Social Needs  . Financial resource strain: Not on file  . Food insecurity:    Worry: Not on file    Inability: Not on file  . Transportation needs:    Medical: Not on file    Non-medical: Not on file  Tobacco Use  . Smoking status: Former Smoker    Years: 20.00    Last attempt to quit: 03/10/1993    Years since quitting: 24.7  . Smokeless tobacco: Never Used  Substance and Sexual Activity  . Alcohol use: No    Alcohol/week: 0.0 standard drinks  . Drug use: No  . Sexual activity: Not on file  Lifestyle  . Physical activity:    Days per week: Not on file    Minutes per session: Not on file  . Stress: Not on file  Relationships  . Social connections:    Talks on phone: Not on file    Gets together: Not on file    Attends religious service: Not on file    Active member of club or organization: Not on file    Attends meetings of clubs or organizations: Not on file    Relationship status: Not on file  . Intimate partner violence:    Fear of current or ex partner: Not on file    Emotionally abused:  Not on file    Physically abused: Not on file    Forced sexual activity: Not on file  Other Topics Concern  . Not on file  Social History Narrative  . Not on file    The patient currently resides (home / rehab facility / nursing home): Home The patient normally is (ambulatory / bedbound): Ambulatory   FAMILY HISTORY:  Family History  Problem Relation Age of Onset  . Cervical cancer Daughter   . Breast cancer Neg Hx      REVIEW OF SYSTEMS:  Constitutional: denies weight loss, fever, chills, or sweats  Eyes: denies any other vision changes, history of eye injury  ENT: denies sore throat, hearing problems  Respiratory: denies shortness of breath, wheezing  Cardiovascular: denies chest pain, palpitations  Gastrointestinal:+ abdominal pain, N/V, or diarrhea/and bowel function  as per HPI Genitourinary: denies burning with urination or urinary frequency Musculoskeletal: denies any other joint pains or cramps  Skin: denies any other rashes or skin discolorations  Neurological: denies any other headache, dizziness, weakness  Psychiatric: denies any other depression, anxiety   All other review of systems were negative   VITAL SIGNS:  Temp:  [98 F (36.7 C)-99.1 F (37.3 C)] 98 F (36.7 C) (09/12 0531) Pulse Rate:  [65-91] 73 (09/12 0531) Resp:  [16-20] 16 (09/12 0531) BP: (144-193)/(82-110) 144/83 (09/12 0531) SpO2:  [90 %-100 %] 99 % (09/12 0531) Weight:  [77.1 kg] 77.1 kg (09/11 1557)     Height: 5\' 3"  (160 cm) Weight: 77.1 kg BMI (Calculated): 30.12   INTAKE/OUTPUT:  This shift: No intake/output data recorded.  Last 2 shifts: @IOLAST2SHIFTS @   PHYSICAL EXAM:  Constitutional:  -- Obese body habitus  -- Awake, alert, and oriented x3, no apparent distress Eyes:  -- Pupils equally round and reactive to light  -- No scleral icterus, B/L no occular discharge Ear, nose, throat: -- Neck is FROM WNL Pulmonary:  -- No wheezes or rhales -- Equal breath sounds bilaterally -- Breathing non-labored at rest Cardiovascular:  -- S1, S2 present  -- No pericardial rubs  Gastrointestinal:  -- Abdomen soft, minimal LLQ tenderness, non-distended, no guarding or rebound tenderness -- No abdominal masses appreciated, pulsatile or otherwise  Musculoskeletal and Integumentary: -- Extremities: B/L UE and LE FROM, hands and feet warm, no edema  Neurologic:  -- Motor function: Intact and symmetric -- Sensation: Intact and symmetric Psychiatric:  -- Mood and affect WNL     Labs:  CBC Latest Ref Rng & Units 11/19/2017 11/18/2017 01/11/2013  WBC 3.6 - 11.0 K/uL 9.0 9.3 9.5  Hemoglobin 12.0 - 16.0 g/dL 12.8 14.0 14.4  Hematocrit 35.0 - 47.0 % 37.7 41.2 42.4  Platelets 150 - 440 K/uL 238 255 260   CMP Latest Ref Rng & Units 11/19/2017 11/18/2017 01/11/2013  Glucose 70  - 99 mg/dL 120(H) 111(H) 89  BUN 8 - 23 mg/dL 10 10 8   Creatinine 0.44 - 1.00 mg/dL 0.70 0.77 0.88  Sodium 135 - 145 mmol/L 136 138 137  Potassium 3.5 - 5.1 mmol/L 3.7 3.7 3.5  Chloride 98 - 111 mmol/L 105 106 104  CO2 22 - 32 mmol/L 26 25 31   Calcium 8.9 - 10.3 mg/dL 9.5 10.4(H) 10.1  Total Protein 6.5 - 8.1 g/dL - 7.9 8.3(H)  Total Bilirubin 0.3 - 1.2 mg/dL - 0.7 0.7  Alkaline Phos 38 - 126 U/L - 94 143(H)  AST 15 - 41 U/L - 15 16  ALT 0 - 44 U/L - 17  19    Imaging studies:  CT abdomen/Pelvis from [facility] on 11/17/17:   --2.9 cm gas and fluid containing, rim-enhancing collection abutting the superior aspect of the sigmoid colon with adjacent inflammatory stranding, compatible with sigmoid diverticulitis with abscess formation.  --Additional incidental/chronic findings as above.  Assessment/Plan: (ICD-10's: K24.32) 72 y.o. female with complicated diverticulitis with abscess without perforation, complicated by pertinent comorbidities including obesity, HTN, HLD, Former smoker.   - She is showing some clinical improvement this mornign   - Given improvement, will discuss with radiologist about possible IR drainage however, will hold off and treat with ABx for now. If she were to worsen tomorrow, will consider re-imaging  - Will start on IV Zosyn given complicated diverticulitis  - Continue liquid diet as tolerated  - Mobilize   - DVT prophylaxis  - Additional management per medicine team.   All of the above findings and recommendations were discussed with the patient, and all of patient's questions were answered to her expressed satisfaction.  Thank you for the opportunity to participate in this patient's care.   -- Edison Simon, PA-C [facility] 11/19/2017, 10:22 AM [phone] M-F: 7am - 4pm

## 2017-11-20 ENCOUNTER — Inpatient Hospital Stay: Payer: Medicare Other

## 2017-11-20 ENCOUNTER — Inpatient Hospital Stay: Payer: Self-pay

## 2017-11-20 LAB — CBC
HCT: 40.8 % (ref 35.0–47.0)
HEMOGLOBIN: 13.5 g/dL (ref 12.0–16.0)
MCH: 29.4 pg (ref 26.0–34.0)
MCHC: 33.2 g/dL (ref 32.0–36.0)
MCV: 88.5 fL (ref 80.0–100.0)
Platelets: 235 10*3/uL (ref 150–440)
RBC: 4.61 MIL/uL (ref 3.80–5.20)
RDW: 14.3 % (ref 11.5–14.5)
WBC: 8 10*3/uL (ref 3.6–11.0)

## 2017-11-20 LAB — BASIC METABOLIC PANEL
ANION GAP: 6 (ref 5–15)
BUN: 7 mg/dL — ABNORMAL LOW (ref 8–23)
CHLORIDE: 105 mmol/L (ref 98–111)
CO2: 24 mmol/L (ref 22–32)
Calcium: 9.4 mg/dL (ref 8.9–10.3)
Creatinine, Ser: 0.71 mg/dL (ref 0.44–1.00)
GFR calc Af Amer: >60 mL/min (ref >60)
GFR calc non Af Amer: >60 mL/min (ref >60)
Glucose, Bld: 79 mg/dL (ref 70–99)
POTASSIUM: 3.7 mmol/L (ref 3.5–5.1)
Sodium: 135 mmol/L (ref 135–145)

## 2017-11-20 LAB — PROTIME-INR
INR: 1.13
Prothrombin Time: 14.4 seconds (ref 11.4–15.2)

## 2017-11-20 LAB — HIV ANTIBODY (ROUTINE TESTING W REFLEX): HIV Screen 4th Generation wRfx: NONREACTIVE

## 2017-11-20 MED ORDER — KETOROLAC TROMETHAMINE 15 MG/ML IJ SOLN
15.0000 mg | Freq: Four times a day (QID) | INTRAMUSCULAR | Status: DC | PRN
  Administered 2017-11-20 – 2017-11-21 (×3): 15 mg via INTRAVENOUS
  Filled 2017-11-20 (×4): qty 1

## 2017-11-20 MED ORDER — ACETAMINOPHEN 325 MG PO TABS
650.0000 mg | ORAL_TABLET | Freq: Four times a day (QID) | ORAL | Status: DC | PRN
  Administered 2017-11-20 (×2): 650 mg via ORAL
  Filled 2017-11-20 (×2): qty 2

## 2017-11-20 MED ORDER — HYDRALAZINE HCL 20 MG/ML IJ SOLN
10.0000 mg | Freq: Four times a day (QID) | INTRAMUSCULAR | Status: DC | PRN
  Administered 2017-11-20 – 2017-11-22 (×2): 10 mg via INTRAVENOUS
  Filled 2017-11-20 (×2): qty 1

## 2017-11-20 MED ORDER — FENTANYL CITRATE (PF) 100 MCG/2ML IJ SOLN
INTRAMUSCULAR | Status: AC | PRN
  Administered 2017-11-20: 25 ug via INTRAVENOUS
  Administered 2017-11-20: 50 ug via INTRAVENOUS
  Administered 2017-11-20: 25 ug via INTRAVENOUS

## 2017-11-20 MED ORDER — MIDAZOLAM HCL 5 MG/5ML IJ SOLN
INTRAMUSCULAR | Status: AC
  Filled 2017-11-20: qty 5

## 2017-11-20 MED ORDER — FENTANYL CITRATE (PF) 100 MCG/2ML IJ SOLN
INTRAMUSCULAR | Status: AC
  Filled 2017-11-20: qty 4

## 2017-11-20 MED ORDER — LIDOCAINE HCL (PF) 1 % IJ SOLN
INTRAMUSCULAR | Status: AC | PRN
  Administered 2017-11-20: 16 mL

## 2017-11-20 MED ORDER — MIDAZOLAM HCL 5 MG/5ML IJ SOLN
INTRAMUSCULAR | Status: AC | PRN
  Administered 2017-11-20 (×2): 1 mg via INTRAVENOUS
  Administered 2017-11-20 (×3): 0.5 mg via INTRAVENOUS

## 2017-11-20 MED ORDER — SODIUM CHLORIDE 0.9% FLUSH
5.0000 mL | Freq: Three times a day (TID) | INTRAVENOUS | Status: DC
  Administered 2017-11-20 – 2017-11-22 (×7): 5 mL

## 2017-11-20 NOTE — Progress Notes (Addendum)
Patient re-evaluated at bedside with Dr. Caroleen Hamman, MD post CT guided drain placement. Tolerated the procedure well.   Per nursing staff, she is now reporting chills but remains afebrile at 97.4, hypertensive receiving hydralazine, and WBC from 1200 was within normal limits at 8.0.   Abdomen is diffusely tender on re-examination without rebound, guarding, or rigidity.JP  Drain in LLQ with purulent drainage.   This may likely be transient bacteremia, however, concern for bowel injury remains given recent procedure and abscess location.   PLAN: Continue IV Zosyn, increased IVF to 100 ml/hr, NPO for now, added morning abdomen XR and lab work, continue serial abdominal examinations. Tylenol for fever control. Continue current pain management.

## 2017-11-20 NOTE — Consult Note (Addendum)
Chief Complaint: Patient was seen in consultation today for  CT guided drainage of abdomino-pelvic abscess  Chief Complaint  Patient presents with  . Abdominal Pain    Referring Physician(s): Byrnett,J  Supervising Physician: Aletta Edouard  Patient Status: [facility] - In-pt  History of Present Illness: Helen May is a 72 y.o. female with hx of diverticulosis and recent admission to [facility] for persistent lower abd pain. She went to [facility] on 11/17/17 for this pain and was diagnosed with diverticulitis with a 2.9 cm abscess by CT imaging. However, she left AMA because the wait was too long and presented here for evaluation and management. Follow up imaging now reveals:    1. Focal wall thickening of the sigmoid:, consistent with diverticulitis. 3.7 x 4.4 cm masslike extension off the inflamed sigmoid colon with surrounding foci of extraluminal gas and moderate-to-marked pericolonic inflammation. Further characterisation of the mass is limited without intravenous contrast. Findings could be secondary to perforated diverticulitis with abscess. Perforated colon mass is also included in the differential, given shape and configuration of the lesion. 2. High density sludge versus vicarious excretion of contrast within the gallbladder. 3. 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 She currently denies fever,HA,CP,dyspnea, cough, N/V or bleeding. She is anxious.   Past Medical History:  Diagnosis Date  . Arthritis   . Asthma   . Hyperlipidemia   . Hypertension   . Pneumonia     Past Surgical History:  Procedure Laterality Date  . Removal of Carotid Mass Right 15 years ago  . TUBAL LIGATION  1972    Allergies: Patient has no known allergies.  Medications: Prior to Admission medications   Medication Sig Start Date End Date Taking?  Authorizing Provider  hydrochlorothiazide (HYDRODIURIL) 25 MG tablet Take 25 mg by mouth daily.    [provider]  meloxicam (MOBIC) 7.5 MG tablet Take 7.5 mg by mouth as needed.     [provider]  montelukast (SINGULAIR) 10 MG tablet Take 1 tablet (10 mg total) by mouth at bedtime. Patient not taking: Reported on 11/19/2017 05/10/15   Wilhelmina Mcardle, MD     Family History  Problem Relation Age of Onset  . Cervical cancer Daughter   . Breast cancer Neg Hx     Social History   Socioeconomic History  . Marital status: Widowed    Spouse name: Not on file  . Number of children: Not on file  . Years of education: Not on file  . Highest education level: Not on file  Occupational History  . Not on file  Social Needs  . Financial resource strain: Not on file  . Food insecurity:    Worry: Not on file    Inability: Not on file  . Transportation needs:    Medical: Not on file    Non-medical: Not on file  Tobacco Use  . Smoking status: Former Smoker    Years: 20.00    Last attempt to quit: 03/10/1993    Years since quitting: 24.7  . Smokeless tobacco: Never Used  Substance and Sexual Activity  . Alcohol use: No    Alcohol/week: 0.0 standard drinks  . Drug use: No  . Sexual activity: Not on file  Lifestyle  . Physical activity:    Days per week: Not on file    Minutes per session: Not on file  . Stress: Not on  file  Relationships  . Social connections:    Talks on phone: Not on file    Gets together: Not on file    Attends religious service: Not on file    Active member of club or organization: Not on file    Attends meetings of clubs or organizations: Not on file    Relationship status: Not on file  Other Topics Concern  . Not on file  Social History Narrative  . Not on file      Review of Systems see above  Vital Signs: BP (!) 177/96 (BP Location: Right Arm)   Pulse 75   Temp 99 F (37.2 C) (Oral)   Resp 20   Ht 5\' 3"  (1.6 m)   Wt 170 lb  (77.1 kg)   SpO2 99%   BMI 30.11 kg/m   Physical Exam awake/alert; chest- CTA bilat; heart- RRR; abd- soft,+BS,mildly tender lower pelvic region; no sig LE edema  Imaging: Ct Abdomen Pelvis Wo Contrast  Result Date: 11/19/2017 CLINICAL DATA:  Diffuse lower abdominal pain previous CT demonstrating abscess EXAM: CT ABDOMEN AND PELVIS WITHOUT CONTRAST TECHNIQUE: Multidetector CT imaging of the abdomen and pelvis was performed following the standard protocol without IV contrast. COMPARISON:  11/18/2017, CT 01/11/2013 FINDINGS: Lower chest: Lung bases demonstrate no acute consolidation or pleural effusion. Mild cardiomegaly. Hepatobiliary: Small cyst in the anterior left hepatic lobe. Probable focal steatosis near the falciform ligament. High density material in the gallbladder may reflect sludge or vicarious excretion. No biliary dilatation Pancreas: Unremarkable. No pancreatic ductal dilatation or surrounding inflammatory changes. Spleen: Normal in size without focal abnormality. Adrenals/Urinary Tract: Adrenal glands are unremarkable. Kidneys are normal, without renal calculi, focal lesion, or hydronephrosis. Bladder is unremarkable. Stomach/Bowel: No dilated small bowel. The stomach is within normal limits. Wall thickening of the sigmoid colon. This is contiguous with a soft tissue density within the upper pelvis slightly to the left of midline measuring 4.4 cm by 3.7 cm that extends cephalad from the inflamed sigmoid colon. Multiple foci of extraluminal gas in the region. Moderate surrounding soft tissue inflammatory changes. Negative appendix. Vascular/Lymphatic: Aortic atherosclerosis. Subcentimeter pelvic nodes. Reproductive: Uterus and bilateral adnexa are unremarkable. Other: No significant pelvic effusion.  Fat in the umbilical region Musculoskeletal: Degenerative changes of the spine. Fat density mass lesion measuring 9.1 cm posterior within the quadratus femoris and abductor muscles, likely  representing intramuscular lipoma. IMPRESSION: 1. Focal wall thickening of the sigmoid:, consistent with diverticulitis. 3.7 x 4.4 cm masslike extension off the inflamed sigmoid colon with surrounding foci of extraluminal gas and moderate-to-marked pericolonic inflammation. Further characterisation of the mass is limited without intravenous contrast. Findings could be secondary to perforated diverticulitis with abscess. Perforated colon mass is also included in the differential, given shape and configuration of the lesion. 2. High density sludge versus vicarious excretion of contrast within the gallbladder. 3. 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   On: 11/19/2017 23:05   Dg Abdomen Acute W/chest  Result Date: 11/18/2017 CLINICAL DATA:  Sigmoid abscess EXAM: DG ABDOMEN ACUTE W/ 1V CHEST COMPARISON:  11/11/2010 FINDINGS: The heart is mildly enlarged. Lungs are under aerated with bibasilar atelectasis. Normal vascularity. There is no free intraperitoneal gas. There are no disproportionally dilated loops of bowel. Phleboliths project over the pelvis. Degenerative changes in the lower lumbar spine IMPRESSION: Bibasilar atelectasis Cardiomegaly Nonobstructive bowel gas pattern. Electronically Signed   By: Rodena Goldmann.D.  On: 11/18/2017 19:11   Ct Outside Films Body  Result Date: 11/20/2017 This examination belongs to an outside facility and is stored here for comparison purposes only.  Contact the originating outside institution for any associated report or interpretation.   Labs:  CBC: Recent Labs    11/18/17 1558 11/19/17 0436  WBC 9.3 9.0  HGB 14.0 12.8  HCT 41.2 37.7  PLT 255 238    COAGS: Recent Labs    11/20/17 0343  INR 1.13    BMP: Recent Labs    11/18/17 1558 11/19/17 0436  NA 138 136  K 3.7 3.7  CL 106 105  CO2 25 26  GLUCOSE 111* 120*  BUN 10 10  CALCIUM 10.4* 9.5    CREATININE 0.77 0.70  GFRNONAA >60 >60  GFRAA >60 >60    LIVER FUNCTION TESTS: Recent Labs    11/18/17 1558  BILITOT 0.7  AST 15  ALT 17  ALKPHOS 94  PROT 7.9  ALBUMIN 3.8    TUMOR MARKERS: No results for input(s): AFPTM, CEA, CA199, CHROMGRNA in the last 8760 hours.  Assessment and Plan: 72 y.o. female with hx of diverticulosis and recent admission to [facility] for persistent lower abd pain. She went to [facility] on 11/17/17 for this pain and was diagnosed with diverticulitis with a 2.9 cm abscess by CT imaging. However, she left AMA because the wait was too long and presented here for evaluation and management. Follow up imaging now reveals:    1. Focal wall thickening of the sigmoid:, consistent with diverticulitis. 3.7 x 4.4 cm masslike extension off the inflamed sigmoid colon with surrounding foci of extraluminal gas and moderate-to-marked pericolonic inflammation. Further characterisation of the mass is limited without intravenous contrast. Findings could be secondary to perforated diverticulitis with abscess. Perforated colon mass is also included in the differential, given shape and configuration of the lesion. 2. High density sludge versus vicarious excretion of contrast within the gallbladder. 3. Large intramuscular fat density mass/probable lipoma within the posterior right hip  Request now received for image guided drainage of this diverticular abscess.Studies have been reviewed by Dr. Kathlene Cote. Risks and benefits discussed with the patient including bleeding, infection, damage to adjacent structures, bowel perforation/fistula connection, and sepsis.  All of the patient's questions were answered, patient is agreeable to proceed. Consent signed and in chart.  Procedure scheduled for today   Thank you for this interesting consult.  I greatly enjoyed meeting Helen May and look forward to participating in their care.  A copy of this report was sent to the  requesting provider on this date.  Electronically Signed: D. Rowe Robert, PA-C 11/20/2017, 9:48 AM   I spent a total of 30 minutes    in face to face in clinical consultation, greater than 50% of which was counseling/coordinating care for CT guided aspiration/possible drainage of pelvic abscess

## 2017-11-20 NOTE — Procedures (Signed)
Interventional Radiology Procedure Note  Procedure: CT guided catheter drainage of sigmoid diverticular abscess  Complications: Probable left rectus muscle hemorrhage  Estimated Blood Loss: < 10 mL  Findings: Diverticular abscess yielded purulent fluid.  During procedure, thickening of left rectus muscle noted along needle path, consistent with some intramuscular hemorrhage.  Likely not significant, but may lead to some tenderness of abdominal wall at drain insertion site.  10 Fr drain placed and attached to suction bulb drainage.  Sample of fluid sent for culture.  Venetia Night. Kathlene Cote, M.D Pager:  [phone]

## 2017-11-20 NOTE — Progress Notes (Signed)
[place] [facility] [place] NAME: Helen May    MR#:  [id number]  DATE OF BIRTH:  10-28-1945  SUBJECTIVE:   Patient presents to the hospital due to abdominal pain and noted to have acute diverticulitis with a diverticular abscess.    Seen by general surgery and also interventional radiology and status post IR guided diverticular abscess drain placed today.  Patient has some pus drainage from that JP drain.  Still having some abdominal pain.  REVIEW OF SYSTEMS:    Review of Systems  Constitutional: Negative for chills and fever.  HENT: Negative for congestion and tinnitus.   Eyes: Negative for blurred vision and double vision.  Respiratory: Negative for cough, shortness of breath and wheezing.   Cardiovascular: Negative for chest pain, orthopnea and PND.  Gastrointestinal: Positive for abdominal pain. Negative for diarrhea, nausea and vomiting.  Genitourinary: Negative for dysuria and hematuria.  Neurological: Negative for dizziness, sensory change and focal weakness.  All other systems reviewed and are negative.   Nutrition: NPO Tolerating Diet: NO Tolerating PT: Await eval.    DRUG ALLERGIES:  No Known Allergies  VITALS:  Blood pressure (!) 191/95, pulse 72, temperature (!) 100.4 F (38 C), temperature source Oral, resp. rate 17, height 5\' 3"  (1.6 m), weight 77.1 kg, SpO2 97 %.  PHYSICAL EXAMINATION:   Physical Exam  GENERAL:  72 y.o.-year-old patient lying in bed in no acute distress.  EYES: Pupils equal, round, reactive to light and accommodation. No scleral icterus. Extraocular muscles intact.  HEENT: Head atraumatic, normocephalic. Oropharynx and nasopharynx clear.  NECK:  Supple, no jugular venous distention. No thyroid enlargement, no tenderness.  LUNGS: Normal breath sounds bilaterally, no wheezing, rales, rhonchi. No use of accessory muscles of respiration.  CARDIOVASCULAR: S1, S2 normal. No murmurs, rubs, or gallops.   ABDOMEN: Soft, nontender, nondistended. Bowel sounds present. No organomegaly or mass. + Left abdomen JP drain in place with some pus colored drainage noted. EXTREMITIES: No cyanosis, clubbing or edema b/l.    NEUROLOGIC: Cranial nerves II through XII are intact. No focal Motor or sensory deficits b/l.   PSYCHIATRIC: The patient is alert and oriented x 3.  SKIN: No obvious rash, lesion, or ulcer.    LABORATORY PANEL:   CBC Recent Labs  Lab 11/20/17 1153  WBC 8.0  HGB 13.5  HCT 40.8  PLT 235   ------------------------------------------------------------------------------------------------------------------  Chemistries  Recent Labs  Lab 11/18/17 1558  11/20/17 1153  NA 138   < > 135  K 3.7   < > 3.7  CL 106   < > 105  CO2 25   < > 24  GLUCOSE 111*   < > 79  BUN 10   < > 7*  CREATININE 0.77   < > 0.71  CALCIUM 10.4*   < > 9.4  AST 15  --   --   ALT 17  --   --   ALKPHOS 94  --   --   BILITOT 0.7  --   --    < > = values in this interval not displayed.   ------------------------------------------------------------------------------------------------------------------  Cardiac Enzymes No results for input(s): TROPONINI in the last 168 hours. ------------------------------------------------------------------------------------------------------------------  RADIOLOGY:  Ct Abdomen Pelvis Wo Contrast  Result Date: 11/19/2017 CLINICAL DATA:  Diffuse lower abdominal pain previous CT demonstrating abscess EXAM: CT ABDOMEN AND PELVIS WITHOUT CONTRAST TECHNIQUE: Multidetector CT imaging of the abdomen and pelvis was performed following the standard protocol without IV  contrast. COMPARISON:  11/18/2017, CT 01/11/2013 FINDINGS: Lower chest: Lung bases demonstrate no acute consolidation or pleural effusion. Mild cardiomegaly. Hepatobiliary: Small cyst in the anterior left hepatic lobe. Probable focal steatosis near the falciform ligament. High density material in the gallbladder may  reflect sludge or vicarious excretion. No biliary dilatation Pancreas: Unremarkable. No pancreatic ductal dilatation or surrounding inflammatory changes. Spleen: Normal in size without focal abnormality. Adrenals/Urinary Tract: Adrenal glands are unremarkable. Kidneys are normal, without renal calculi, focal lesion, or hydronephrosis. Bladder is unremarkable. Stomach/Bowel: No dilated small bowel. The stomach is within normal limits. Wall thickening of the sigmoid colon. This is contiguous with a soft tissue density within the upper pelvis slightly to the left of midline measuring 4.4 cm by 3.7 cm that extends cephalad from the inflamed sigmoid colon. Multiple foci of extraluminal gas in the region. Moderate surrounding soft tissue inflammatory changes. Negative appendix. Vascular/Lymphatic: Aortic atherosclerosis. Subcentimeter pelvic nodes. Reproductive: Uterus and bilateral adnexa are unremarkable. Other: No significant pelvic effusion.  Fat in the umbilical region Musculoskeletal: Degenerative changes of the spine. 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 IMPRESSION: 1. Focal wall thickening of the sigmoid:, consistent with diverticulitis. 3.7 x 4.4 cm masslike extension off the inflamed sigmoid colon with surrounding foci of extraluminal gas and moderate-to-marked pericolonic inflammation. Further characterisation of the mass is limited without intravenous contrast. Findings could be secondary to perforated diverticulitis with abscess. Perforated colon mass is also included in the differential, given shape and configuration of the lesion. 2. High density sludge versus vicarious excretion of contrast within the gallbladder. 3. 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   On: 11/19/2017 23:05   Dg Abdomen Acute  W/chest  Result Date: 11/18/2017 CLINICAL DATA:  Sigmoid abscess EXAM: DG ABDOMEN ACUTE W/ 1V CHEST COMPARISON:  11/11/2010 FINDINGS: The heart is mildly enlarged. Lungs are under aerated with bibasilar atelectasis. Normal vascularity. There is no free intraperitoneal gas. There are no disproportionally dilated loops of bowel. Phleboliths project over the pelvis. Degenerative changes in the lower lumbar spine IMPRESSION: Bibasilar atelectasis Cardiomegaly Nonobstructive bowel gas pattern. Electronically Signed   By: Marybelle Killings M.D.   On: 11/18/2017 19:11   Ct Image Guided Drainage By Percutaneous Catheter  Result Date: 11/20/2017 CLINICAL DATA:  Sigmoid diverticulitis with development of diverticular abscess. EXAM: CT GUIDED CATHETER DRAINAGE OF PERITONEAL DIVERTICULAR ABSCESS ANESTHESIA/SEDATION: 3.5 mg IV Versed 100 mcg IV Fentanyl Total Moderate Sedation Time:  31 minutes The patient's level of consciousness and physiologic status were continuously monitored during the procedure by Radiology nursing. PROCEDURE: The procedure, risks, benefits, and alternatives were explained to the patient. Questions regarding the procedure were encouraged and answered. The patient understands and consents to the procedure. A time out was performed prior to initiating the procedure. CT was performed through the pelvis in a supine position. The left abdominal wall was prepped with chlorhexidine in a sterile fashion, and a sterile drape was applied covering the operative field. A sterile gown and sterile gloves were used for the procedure. Local anesthesia was provided with 1% Lidocaine. Under CT guidance, an 18 gauge trocar needle was advanced into a left-sided pelvic diverticular abscess lying superior to the lumen of the distal sigmoid colon. After needle advancement, aspiration of fluid was performed. A guidewire was advanced into the collection. The tract was dilated and a 10 [place] percutaneous drainage  catheter  placed. A fluid sample was withdrawn and sent for culture analysis. The catheter was flushed with saline and connected to a suction bulb. It was secured at the skin with a Prolene retention suture and adhesive StatLock device. COMPLICATIONS: Intramuscular hemorrhage in the left rectus muscle. SIR level A: No therapy, no consequence. FINDINGS: Abscess abutting the sigmoid colon was targeted and measures approximately 4.5 cm in diameter. Aspiration yielded grossly purulent fluid. During the procedure after needle advancement, it became evident that there was some thickening of the left abdominal wall rectus musculature. This reached a maximal thickness of 2.6 cm compared to 1.1 cm prior to needle advancement. Findings are consistent with intramuscular hemorrhage during the procedure. This will be followed clinically and a follow-up CT also performed once fluid drainage from the abscess cavity diminishes significantly. IMPRESSION: 1. Percutaneous catheter drainage of sigmoid diverticular abscess with placement of 10 French percutaneous drain. The drain is attached to suction bulb drainage. A sample of purulent fluid was sent for culture analysis. 2. The procedure was complicated by some intramuscular hemorrhage within the left rectus muscle. This did not result in significant pain at the time of drainage catheter placement. Electronically Signed   By: Aletta Edouard M.D.   On: 11/20/2017 13:43   Ct Outside Films Body  Result Date: 11/20/2017 This examination belongs to an outside facility and is stored here for comparison purposes only.  Contact the originating outside institution for any associated report or interpretation.    ASSESSMENT AND PLAN:   72 year old female with past medical history of hypertension, hyperlipidemia, asthma, osteoarthritis who presented to the hospital due to abdominal pain and noted to have acute diverticulitis with abscess formation.  1.  Acute diverticulitis with abscess- this  was initially diagnosed based on a CT done at [facility].  She was not satisfied with her care there and therefore presented to the ER here. -Repeat CT scan of the abdomen pelvis yesterday showed diverticulitis with a diverticular abscess, discussed with general surgery and the recommended IR guided drainage of the abscess.  Patient is status post percutaneous CT-guided drainage of the abscess with a JP drain placed. - Continue broad-spectrum IV antibiotics with Zosyn and further care as per general surgery and interventional radiology. -Keep n.p.o. and follow-up x-ray in the a.m.  Continue supportive care with pain control, IV fluids, antiemetics. -Patient has a low-grade fever but white cell count is normal and she is hemodynamically stable.  2.  Essential hypertension- BP somewhat elevated today due to pain  - cont. HCTZ, will add some PRN Hydralazine.    All the records are reviewed and case discussed with Care Management/Social Worker. Management plans discussed with the patient, family and they are in agreement.  CODE STATUS: Full code  DVT Prophylaxis: Hep SQ  TOTAL TIME TAKING CARE OF THIS PATIENT: 30 minutes.   POSSIBLE D/C IN 2-3 DAYS, DEPENDING ON CLINICAL CONDITION.   Henreitta Leber M.D on 11/20/2017 at 4:04 PM  Between 7am to 6pm - Pager - [phone]  After 6pm go to [email or web address] - Proofreader  [facility]  Office  [phone]  CC: Primary care physician; Casilda Carls, MD (Inactive)

## 2017-11-20 NOTE — Progress Notes (Signed)
[facility] Progress Note     Subjective: Patient resting comfortably at edge of bed. She endorses continued improvement in her abdominal pain this morning. No complaints of nausea, emesis, or bowel changes. No fevers, chills.   Objective: Vital signs in last 24 hours: Temp:  [98.4 F (36.9 C)-99.1 F (37.3 C)] 99 F (37.2 C) (09/13 0423) Pulse Rate:  [74-80] 75 (09/13 0423) Resp:  [16-20] 20 (09/13 0423) BP: (161-180)/(83-98) 177/96 (09/13 0423) SpO2:  [99 %-100 %] 99 % (09/13 0423) Last BM Date: 11/19/17  Intake/Output from previous day: 09/12 0701 - 09/13 0700 In: 1234.9 [I.V.:1137.1; IV Piggyback:97.8] Out: 300 [Urine:300] Intake/Output this shift: No intake/output data recorded.  PE: Gen:  Alert, NAD, pleasant Pulm:  Normal effort Abd: Soft, diffuse lower abdominal tenderness, non-distended Skin: warm and dry, no rashes  Psych: A&Ox3   Lab Results:  Recent Labs    11/18/17 1558 11/19/17 0436  WBC 9.3 9.0  HGB 14.0 12.8  HCT 41.2 37.7  PLT 255 238   BMET Recent Labs    11/18/17 1558 11/19/17 0436  NA 138 136  K 3.7 3.7  CL 106 105  CO2 25 26  GLUCOSE 111* 120*  BUN 10 10  CREATININE 0.77 0.70  CALCIUM 10.4* 9.5   PT/INR Recent Labs    11/20/17 0343  LABPROT 14.4  INR 1.13   CMP     Component Value Date/Time   NA 136 11/19/2017 0436   NA 137 01/11/2013 1522   K 3.7 11/19/2017 0436   K 3.5 01/11/2013 1522   CL 105 11/19/2017 0436   CL 104 01/11/2013 1522   CO2 26 11/19/2017 0436   CO2 31 01/11/2013 1522   GLUCOSE 120 (H) 11/19/2017 0436   GLUCOSE 89 01/11/2013 1522   BUN 10 11/19/2017 0436   BUN 8 01/11/2013 1522   CREATININE 0.70 11/19/2017 0436   CREATININE 0.88 01/11/2013 1522   CALCIUM 9.5 11/19/2017 0436   CALCIUM 10.1 01/11/2013 1522   PROT 7.9 11/18/2017 1558   PROT 8.3 (H) 01/11/2013 1522   ALBUMIN 3.8 11/18/2017 1558   ALBUMIN 3.5 01/11/2013 1522   AST 15 11/18/2017 1558   AST 16 01/11/2013 1522   ALT  17 11/18/2017 1558   ALT 19 01/11/2013 1522   ALKPHOS 94 11/18/2017 1558   ALKPHOS 143 (H) 01/11/2013 1522   BILITOT 0.7 11/18/2017 1558   BILITOT 0.7 01/11/2013 1522   GFRNONAA >60 11/19/2017 0436   GFRNONAA >60 01/11/2013 1522   GFRAA >60 11/19/2017 0436   GFRAA >60 01/11/2013 1522   Lipase     Component Value Date/Time   LIPASE 28 11/18/2017 1558   LIPASE 113 01/11/2013 1522       Studies/Results: Ct Abdomen Pelvis Wo Contrast  Result Date: 11/19/2017 CLINICAL DATA:  Diffuse lower abdominal pain previous CT demonstrating abscess EXAM: CT ABDOMEN AND PELVIS WITHOUT CONTRAST TECHNIQUE: Multidetector CT imaging of the abdomen and pelvis was performed following the standard protocol without IV contrast. COMPARISON:  11/18/2017, CT 01/11/2013 FINDINGS: Lower chest: Lung bases demonstrate no acute consolidation or pleural effusion. Mild cardiomegaly. Hepatobiliary: Small cyst in the anterior left hepatic lobe. Probable focal steatosis near the falciform ligament. High density material in the gallbladder may reflect sludge or vicarious excretion. No biliary dilatation Pancreas: Unremarkable. No pancreatic ductal dilatation or surrounding inflammatory changes. Spleen: Normal in size without focal abnormality. Adrenals/Urinary Tract: Adrenal glands are unremarkable. Kidneys are normal, without renal calculi, focal lesion, or hydronephrosis. Bladder is  unremarkable. Stomach/Bowel: No dilated small bowel. The stomach is within normal limits. Wall thickening of the sigmoid colon. This is contiguous with a soft tissue density within the upper pelvis slightly to the left of midline measuring 4.4 cm by 3.7 cm that extends cephalad from the inflamed sigmoid colon. Multiple foci of extraluminal gas in the region. Moderate surrounding soft tissue inflammatory changes. Negative appendix. Vascular/Lymphatic: Aortic atherosclerosis. Subcentimeter pelvic nodes. Reproductive: Uterus and bilateral adnexa are  unremarkable. Other: No significant pelvic effusion.  Fat in the umbilical region Musculoskeletal: Degenerative changes of the spine. 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 IMPRESSION: 1. Focal wall thickening of the sigmoid:, consistent with diverticulitis. 3.7 x 4.4 cm masslike extension off the inflamed sigmoid colon with surrounding foci of extraluminal gas and moderate-to-marked pericolonic inflammation. Further characterisation of the mass is limited without intravenous contrast. Findings could be secondary to perforated diverticulitis with abscess. Perforated colon mass is also included in the differential, given shape and configuration of the lesion. 2. High density sludge versus vicarious excretion of contrast within the gallbladder. 3. 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   On: 11/19/2017 23:05   Dg Abdomen Acute W/chest  Result Date: 11/18/2017 CLINICAL DATA:  Sigmoid abscess EXAM: DG ABDOMEN ACUTE W/ 1V CHEST COMPARISON:  11/11/2010 FINDINGS: The heart is mildly enlarged. Lungs are under aerated with bibasilar atelectasis. Normal vascularity. There is no free intraperitoneal gas. There are no disproportionally dilated loops of bowel. Phleboliths project over the pelvis. Degenerative changes in the lower lumbar spine IMPRESSION: Bibasilar atelectasis Cardiomegaly Nonobstructive bowel gas pattern. Electronically Signed   By: Marybelle Killings M.D.   On: 11/18/2017 19:11   Ct Outside Films Body  Result Date: 11/20/2017 This examination belongs to an outside facility and is stored here for comparison purposes only.  Contact the originating outside institution for any associated report or interpretation.   Anti-infectives: Anti-infectives (From admission, onward)   Start     Dose/Rate Route Frequency  Ordered Stop   11/19/17 1030  piperacillin-tazobactam (ZOSYN) IVPB 3.375 g     3.375 g 12.5 mL/hr over 240 Minutes Intravenous Every 8 hours 11/19/17 1024     11/18/17 1830  cefTRIAXone (ROCEPHIN) 2 g in sodium chloride 0.9 % 100 mL IVPB  Status:  Discontinued     2 g 200 mL/hr over 30 Minutes Intravenous Every 24 hours 11/18/17 1801 11/19/17 1024   11/18/17 1815  metroNIDAZOLE (FLAGYL) IVPB 500 mg  Status:  Discontinued     500 mg 100 mL/hr over 60 Minutes Intravenous Every 8 hours 11/18/17 1801 11/19/17 1024       Assessment/Plan  Diverticulitis with abscess without perforation  - Showing clinical improvement since onset of pain on Monday.  - CT yesterday revealed 3.7 x 4.4 cm focal area concerning for abscess. Will be evaluated by IR for possible drain placement - Will re-check labs this morning - Continue IVF, ABx, Liquid Diet for now (may advance pending IR evaluation) - DVT Prophylaxis, Mobilize as tolerated   LOS: 2 days    Edison Simon , PA-C [facility] 11/20/2017, 8:57 AM [phone] M-F: 7am - 4pm

## 2017-11-20 NOTE — Care Management Important Message (Signed)
Important Message  Patient Details  Name: [facility] MRN: [id number] Date of Birth: Nov 04, 1945   Medicare Important Message Given:  Yes    [name] 11/20/2017, 10:58 AM

## 2017-11-20 NOTE — Sedation Documentation (Signed)
etCO2 monitor broken

## 2017-11-21 ENCOUNTER — Inpatient Hospital Stay: Payer: Medicare Other

## 2017-11-21 DIAGNOSIS — K5720 Diverticulitis of large intestine with perforation and abscess without bleeding: Secondary | ICD-10-CM

## 2017-11-21 LAB — BASIC METABOLIC PANEL
ANION GAP: 7 (ref 5–15)
BUN: 9 mg/dL (ref 8–23)
CHLORIDE: 108 mmol/L (ref 98–111)
CO2: 24 mmol/L (ref 22–32)
CREATININE: 0.77 mg/dL (ref 0.44–1.00)
Calcium: 9.1 mg/dL (ref 8.9–10.3)
GFR calc non Af Amer: >60 mL/min (ref >60)
Glucose, Bld: 91 mg/dL (ref 70–99)
POTASSIUM: 3.4 mmol/L — AB (ref 3.5–5.1)
Sodium: 139 mmol/L (ref 135–145)

## 2017-11-21 LAB — CBC
HCT: 37 % (ref 35.0–47.0)
HEMOGLOBIN: 12.7 g/dL (ref 12.0–16.0)
MCH: 30.1 pg (ref 26.0–34.0)
MCHC: 34.3 g/dL (ref 32.0–36.0)
MCV: 87.7 fL (ref 80.0–100.0)
Platelets: 287 10*3/uL (ref 150–440)
RBC: 4.21 MIL/uL (ref 3.80–5.20)
RDW: 14.3 % (ref 11.5–14.5)
WBC: 8.4 10*3/uL (ref 3.6–11.0)

## 2017-11-21 NOTE — Progress Notes (Signed)
CC: Perforated diverticulitis Subjective: Patient feels little better today.  She still has pain especially when people push on it.  No nausea or vomiting.  Objective: Vital signs in last 24 hours: Temp:  [97.4 F (36.3 C)-100.7 F (38.2 C)] 98.8 F (37.1 C) (09/14 0521) Pulse Rate:  [66-99] 86 (09/14 0521) Resp:  [13-23] 20 (09/14 0521) BP: (155-191)/(80-100) 159/85 (09/14 0521) SpO2:  [95 %-100 %] 100 % (09/14 0521) Last BM Date: 11/19/17  Intake/Output from previous day: 09/13 0701 - 09/14 0700 In: 2410.2 [I.V.:2244.5; IV Piggyback:155.8] Out: 1175 [Urine:1075; Drains:100] Intake/Output this shift: No intake/output data recorded.  Physical exam:  Low-grade temp overnight.  Her vital signs are stable Awake alert and oriented Abdomen is soft minimally distended minimally tender in both lower quadrants.  Drain is in place draining frank pus.  NT calves Lab Results: CBC  Recent Labs    11/20/17 1153 11/21/17 0707  WBC 8.0 8.4  HGB 13.5 12.7  HCT 40.8 37.0  PLT 235 287   BMET Recent Labs    11/19/17 0436 11/20/17 1153  NA 136 135  K 3.7 3.7  CL 105 105  CO2 26 24  GLUCOSE 120* 79  BUN 10 7*  CREATININE 0.70 0.71  CALCIUM 9.5 9.4   PT/INR Recent Labs    11/20/17 0343  LABPROT 14.4  INR 1.13   ABG No results for input(s): PHART, HCO3 in the last 72 hours.  Invalid input(s): PCO2, PO2  Studies/Results: Ct Abdomen Pelvis Wo Contrast  Result Date: 11/19/2017 CLINICAL DATA:  Diffuse lower abdominal pain previous CT demonstrating abscess EXAM: CT ABDOMEN AND PELVIS WITHOUT CONTRAST TECHNIQUE: Multidetector CT imaging of the abdomen and pelvis was performed following the standard protocol without IV contrast. COMPARISON:  11/18/2017, CT 01/11/2013 FINDINGS: Lower chest: Lung bases demonstrate no acute consolidation or pleural effusion. Mild cardiomegaly. Hepatobiliary: Small cyst in the anterior left hepatic lobe. Probable focal steatosis near the falciform  ligament. High density material in the gallbladder may reflect sludge or vicarious excretion. No biliary dilatation Pancreas: Unremarkable. No pancreatic ductal dilatation or surrounding inflammatory changes. Spleen: Normal in size without focal abnormality. Adrenals/Urinary Tract: Adrenal glands are unremarkable. Kidneys are normal, without renal calculi, focal lesion, or hydronephrosis. Bladder is unremarkable. Stomach/Bowel: No dilated small bowel. The stomach is within normal limits. Wall thickening of the sigmoid colon. This is contiguous with a soft tissue density within the upper pelvis slightly to the left of midline measuring 4.4 cm by 3.7 cm that extends cephalad from the inflamed sigmoid colon. Multiple foci of extraluminal gas in the region. Moderate surrounding soft tissue inflammatory changes. Negative appendix. Vascular/Lymphatic: Aortic atherosclerosis. Subcentimeter pelvic nodes. Reproductive: Uterus and bilateral adnexa are unremarkable. Other: No significant pelvic effusion.  Fat in the umbilical region Musculoskeletal: Degenerative changes of the spine. 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 IMPRESSION: 1. Focal wall thickening of the sigmoid:, consistent with diverticulitis. 3.7 x 4.4 cm masslike extension off the inflamed sigmoid colon with surrounding foci of extraluminal gas and moderate-to-marked pericolonic inflammation. Further characterisation of the mass is limited without intravenous contrast. Findings could be secondary to perforated diverticulitis with abscess. Perforated colon mass is also included in the differential, given shape and configuration of the lesion. 2. High density sludge versus vicarious excretion of contrast within the gallbladder. 3. 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  On: 11/19/2017 23:05   Ct Image Guided Drainage By Percutaneous Catheter  Result Date: 11/20/2017 CLINICAL DATA:  Sigmoid diverticulitis with development of diverticular abscess. EXAM: CT GUIDED CATHETER DRAINAGE OF PERITONEAL DIVERTICULAR ABSCESS ANESTHESIA/SEDATION: 3.5 mg IV Versed 100 mcg IV Fentanyl Total Moderate Sedation Time:  31 minutes The patient's level of consciousness and physiologic status were continuously monitored during the procedure by Radiology nursing. PROCEDURE: The procedure, risks, benefits, and alternatives were explained to the patient. Questions regarding the procedure were encouraged and answered. The patient understands and consents to the procedure. A time out was performed prior to initiating the procedure. CT was performed through the pelvis in a supine position. The left abdominal wall was prepped with chlorhexidine in a sterile fashion, and a sterile drape was applied covering the operative field. A sterile gown and sterile gloves were used for the procedure. Local anesthesia was provided with 1% Lidocaine. Under CT guidance, an 18 gauge trocar needle was advanced into a left-sided pelvic diverticular abscess lying superior to the lumen of the distal sigmoid colon. After needle advancement, aspiration of fluid was performed. A guidewire was advanced into the collection. The tract was dilated and a 10 French percutaneous drainage catheter placed. A fluid sample was withdrawn and sent for culture analysis. The catheter was flushed with saline and connected to a suction bulb. It was secured at the skin with a Prolene retention suture and adhesive StatLock device. COMPLICATIONS: Intramuscular hemorrhage in the left rectus muscle. SIR level A: No therapy, no consequence. FINDINGS: Abscess abutting the sigmoid colon was targeted and measures approximately 4.5 cm in diameter. Aspiration yielded grossly purulent fluid. During the procedure after needle advancement, it became evident  that there was some thickening of the left abdominal wall rectus musculature. This reached a maximal thickness of 2.6 cm compared to 1.1 cm prior to needle advancement. Findings are consistent with intramuscular hemorrhage during the procedure. This will be followed clinically and a follow-up CT also performed once fluid drainage from the abscess cavity diminishes significantly. IMPRESSION: 1. Percutaneous catheter drainage of sigmoid diverticular abscess with placement of 10 French percutaneous drain. The drain is attached to suction bulb drainage. A sample of purulent fluid was sent for culture analysis. 2. The procedure was complicated by some intramuscular hemorrhage within the left rectus muscle. This did not result in significant pain at the time of drainage catheter placement. Electronically Signed   By: Aletta Edouard M.D.   On: 11/20/2017 13:43   Ct Outside Films Body  Result Date: 11/20/2017 This examination belongs to an outside facility and is stored here for comparison purposes only.  Contact the originating outside institution for any associated report or interpretation.   Anti-infectives: Anti-infectives (From admission, onward)   Start     Dose/Rate Route Frequency Ordered Stop   11/19/17 1030  piperacillin-tazobactam (ZOSYN) IVPB 3.375 g     3.375 g 12.5 mL/hr over 240 Minutes Intravenous Every 8 hours 11/19/17 1024     11/18/17 1830  cefTRIAXone (ROCEPHIN) 2 g in sodium chloride 0.9 % 100 mL IVPB  Status:  Discontinued     2 g 200 mL/hr over 30 Minutes Intravenous Every 24 hours 11/18/17 1801 11/19/17 1024   11/18/17 1815  metroNIDAZOLE (FLAGYL) IVPB 500 mg  Status:  Discontinued     500 mg 100 mL/hr over 60 Minutes Intravenous Every 8 hours 11/18/17 1801 11/19/17 1024      Assessment/Plan:  Pt doing well but still with frank pus and low grade  temps. Cont IV abx Will adv diet  Florene Glen, MD, FACS  11/21/2017

## 2017-11-22 LAB — CBC WITH DIFFERENTIAL/PLATELET
Basophils Absolute: 0 10*3/uL (ref 0–0.1)
Basophils Relative: 1 %
EOS ABS: 0.1 10*3/uL (ref 0–0.7)
EOS PCT: 1 %
HCT: 33.5 % — ABNORMAL LOW (ref 35.0–47.0)
Hemoglobin: 11.5 g/dL — ABNORMAL LOW (ref 12.0–16.0)
LYMPHS ABS: 0.7 10*3/uL — AB (ref 1.0–3.6)
Lymphocytes Relative: 12 %
MCH: 30 pg (ref 26.0–34.0)
MCHC: 34.3 g/dL (ref 32.0–36.0)
MCV: 87.5 fL (ref 80.0–100.0)
MONOS PCT: 9 %
Monocytes Absolute: 0.6 10*3/uL (ref 0.2–0.9)
Neutro Abs: 4.7 10*3/uL (ref 1.4–6.5)
Neutrophils Relative %: 77 %
PLATELETS: 284 10*3/uL (ref 150–440)
RBC: 3.84 MIL/uL (ref 3.80–5.20)
RDW: 14.5 % (ref 11.5–14.5)
WBC: 6.1 10*3/uL (ref 3.6–11.0)

## 2017-11-22 MED ORDER — SODIUM CHLORIDE 0.9% FLUSH: 5.0000 mL | Freq: Three times a day (TID) | INTRAVENOUS | 0 refills | Status: AC

## 2017-11-22 MED ORDER — AMOXICILLIN-POT CLAVULANATE 875-125 MG PO TABS: 1.0000 | ORAL_TABLET | Freq: Two times a day (BID) | ORAL | 0 refills | Status: DC

## 2017-11-22 MED ORDER — LORAZEPAM 2 MG/ML IJ SOLN: 1.0000 mg | Freq: Once | INTRAMUSCULAR | Status: DC | PRN

## 2017-11-22 MED ORDER — METOPROLOL TARTRATE 25 MG PO TABS
25.0000 mg | ORAL_TABLET | Freq: Two times a day (BID) | ORAL | Status: DC
  Administered 2017-11-22: 25 mg via ORAL
  Filled 2017-11-22: qty 1

## 2017-11-22 MED ORDER — METOPROLOL TARTRATE 25 MG PO TABS: 25.0000 mg | ORAL_TABLET | Freq: Two times a day (BID) | ORAL | 0 refills | Status: DC

## 2017-11-22 MED ORDER — AMOXICILLIN-POT CLAVULANATE 875-125 MG PO TABS
1.0000 | ORAL_TABLET | Freq: Two times a day (BID) | ORAL | Status: DC
  Filled 2017-11-22: qty 1

## 2017-11-22 MED ORDER — AMOXICILLIN-POT CLAVULANATE 875-125 MG PO TABS: 1.0000 | ORAL_TABLET | Freq: Two times a day (BID) | ORAL | Status: DC

## 2017-11-22 NOTE — Progress Notes (Signed)
CC: Perforated diverticulitis Subjective: Patient wants to go home but continues to have pain.  She had a low-grade fever overnight.  Nausea vomiting  Objective: Vital signs in last 24 hours: Temp:  [99.3 F (37.4 C)-99.8 F (37.7 C)] 99.5 F (37.5 C) (09/15 0409) Pulse Rate:  [92-107] 102 (09/15 0409) Resp:  [18-20] 18 (09/15 0409) BP: (156-174)/(78-96) 162/92 (09/15 0409) SpO2:  [93 %-99 %] 93 % (09/15 0409) Last BM Date: 11/19/17  Intake/Output from previous day: 09/14 0701 - 09/15 0700 In: 1896.4 [P.O.:840; I.V.:896.2; IV Piggyback:160.3] Out: 870 [Urine:850; Drains:20] Intake/Output this shift: No intake/output data recorded.  Physical exam:  Vital signs reviewed and stable grade temp overnight Abdomen is tender in the left lower quadrant.  Drain is draining frank purulence. Nontender calves  Lab Results: CBC  Recent Labs    11/21/17 0707 11/22/17 0548  WBC 8.4 6.1  HGB 12.7 11.5*  HCT 37.0 33.5*  PLT 287 284   BMET Recent Labs    11/20/17 1153 11/21/17 0707  NA 135 139  K 3.7 3.4*  CL 105 108  CO2 24 24  GLUCOSE 79 91  BUN 7* 9  CREATININE 0.71 0.77  CALCIUM 9.4 9.1   PT/INR Recent Labs    11/20/17 0343  LABPROT 14.4  INR 1.13   ABG No results for input(s): PHART, HCO3 in the last 72 hours.  Invalid input(s): PCO2, PO2  Studies/Results: Dg Abd 2 Views  Result Date: 11/21/2017 CLINICAL DATA:  Abdominal pain EXAM: ABDOMEN - 2 VIEW COMPARISON:  CT from 2 days ago and yesterday FINDINGS: Percutaneous drainage catheter for diverticular abscess. The catheter appears to be in similar position when compared to CT fluoroscopy. The bowel gas pattern is normal. No concerning mass effect or gas collection. Lung bases are clear. IMPRESSION: 1. Normal bowel gas pattern. 2. Percutaneous drainage catheter in expected position when compared to CT fluoroscopy. Electronically Signed   By: Monte Fantasia M.D.   On: 11/21/2017 07:58   Ct Image Guided Drainage  By Percutaneous Catheter  Result Date: 11/20/2017 CLINICAL DATA:  Sigmoid diverticulitis with development of diverticular abscess. EXAM: CT GUIDED CATHETER DRAINAGE OF PERITONEAL DIVERTICULAR ABSCESS ANESTHESIA/SEDATION: 3.5 mg IV Versed 100 mcg IV Fentanyl Total Moderate Sedation Time:  31 minutes The patient's level of consciousness and physiologic status were continuously monitored during the procedure by Radiology nursing. PROCEDURE: The procedure, risks, benefits, and alternatives were explained to the patient. Questions regarding the procedure were encouraged and answered. The patient understands and consents to the procedure. A time out was performed prior to initiating the procedure. CT was performed through the pelvis in a supine position. The left abdominal wall was prepped with chlorhexidine in a sterile fashion, and a sterile drape was applied covering the operative field. A sterile gown and sterile gloves were used for the procedure. Local anesthesia was provided with 1% Lidocaine. Under CT guidance, an 18 gauge trocar needle was advanced into a left-sided pelvic diverticular abscess lying superior to the lumen of the distal sigmoid colon. After needle advancement, aspiration of fluid was performed. A guidewire was advanced into the collection. The tract was dilated and a 10 French percutaneous drainage catheter placed. A fluid sample was withdrawn and sent for culture analysis. The catheter was flushed with saline and connected to a suction bulb. It was secured at the skin with a Prolene retention suture and adhesive StatLock device. COMPLICATIONS: Intramuscular hemorrhage in the left rectus muscle. SIR level A: No therapy, no consequence. FINDINGS:  Abscess abutting the sigmoid colon was targeted and measures approximately 4.5 cm in diameter. Aspiration yielded grossly purulent fluid. During the procedure after needle advancement, it became evident that there was some thickening of the left abdominal  wall rectus musculature. This reached a maximal thickness of 2.6 cm compared to 1.1 cm prior to needle advancement. Findings are consistent with intramuscular hemorrhage during the procedure. This will be followed clinically and a follow-up CT also performed once fluid drainage from the abscess cavity diminishes significantly. IMPRESSION: 1. Percutaneous catheter drainage of sigmoid diverticular abscess with placement of 10 French percutaneous drain. The drain is attached to suction bulb drainage. A sample of purulent fluid was sent for culture analysis. 2. The procedure was complicated by some intramuscular hemorrhage within the left rectus muscle. This did not result in significant pain at the time of drainage catheter placement. Electronically Signed   By: Aletta Edouard M.D.   On: 11/20/2017 13:43    Anti-infectives: Anti-infectives (From admission, onward)   Start     Dose/Rate Route Frequency Ordered Stop   11/19/17 1030  piperacillin-tazobactam (ZOSYN) IVPB 3.375 g     3.375 g 12.5 mL/hr over 240 Minutes Intravenous Every 8 hours 11/19/17 1024     11/18/17 1830  cefTRIAXone (ROCEPHIN) 2 g in sodium chloride 0.9 % 100 mL IVPB  Status:  Discontinued     2 g 200 mL/hr over 30 Minutes Intravenous Every 24 hours 11/18/17 1801 11/19/17 1024   11/18/17 1815  metroNIDAZOLE (FLAGYL) IVPB 500 mg  Status:  Discontinued     500 mg 100 mL/hr over 60 Minutes Intravenous Every 8 hours 11/18/17 1801 11/19/17 1024      Assessment/Plan:  White blood cell count is normalized.  Patient continues to improve but low-grade temp somewhat lower than it was the night before are all good signs but she still requires IV antibiotics at this point and should not be discharged.  My fear is that switching to oral antibiotics and discharging would result in her coming back with a worsened condition.  Florene Glen, MD, FACS  11/22/2017

## 2017-11-22 NOTE — Discharge Summary (Signed)
[place] [facility] [place] NAME: Graceland Wachter    MR#:  [id number]  DATE OF BIRTH:  04-11-1945  DATE OF ADMISSION:  11/18/2017 ADMITTING PHYSICIAN: Vaughan Basta, MD  DATE OF DISCHARGE: 11/22/2017  PRIMARY CARE PHYSICIAN: Casilda Carls, MD (Inactive)    ADMISSION DIAGNOSIS:  Diverticulitis of large intestine with abscess without bleeding [K57.20]  DISCHARGE DIAGNOSIS:  Principal Problem:   Colonic diverticular abscess Active Problems:   Diverticulitis of large intestine with abscess without bleeding   SECONDARY DIAGNOSIS:   Past Medical History:  Diagnosis Date  . Arthritis   . Asthma   . Hyperlipidemia   . Hypertension   . Pneumonia     HOSPITAL COURSE:   1.  Diverticular abscess.  Patient was placed on IV Zosyn during the hospital course.  Patient had an interventional radiology procedure for a tube placement.  Patient had a CT scan of the abdomen that shows a 3.7 x 4.4 cm soft tissue density that extends from the sigmoid colon with foci of extraluminal gas in that region which is consistent with an abscess.  Perforated colon mass can also be in the differential.  Patient was seen by general surgery on the day of discharge and they recommended further IV antibiotics.  Late this afternoon her daughter was murdered.  Family asked for me to discharge her home.  Nursing staff spoke with the surgeon and they recommended close clinical follow-up and oral antibiotics to go home with.  I switched over to Augmentin.  I told the family and patient that close clinical follow-up with the surgeon this week and medical doctor as needed.  She will likely need a colonoscopy at some point.  Nursing staff will teach the patient how to flush the drain and empty the drain. 2.  Essential hypertension.  Blood pressure elevated after the news today about her daughter.  Continue metoprolol and hydrochlorothiazide  DISCHARGE CONDITIONS:    Fair  CONSULTS OBTAINED:  Treatment Team:  Jules Husbands, MD  DRUG ALLERGIES:  No Known Allergies  DISCHARGE MEDICATIONS:   Allergies as of 11/22/2017   No Known Allergies     Medication List    STOP taking these medications   meloxicam 7.5 MG tablet Commonly known as:  MOBIC   montelukast 10 MG tablet Commonly known as:  SINGULAIR     TAKE these medications   amoxicillin-clavulanate 875-125 MG tablet Commonly known as:  AUGMENTIN Take 1 tablet by mouth every 12 (twelve) hours.   hydrochlorothiazide 25 MG tablet Commonly known as:  HYDRODIURIL Take 25 mg by mouth daily.   metoprolol tartrate 25 MG tablet Commonly known as:  LOPRESSOR Take 1 tablet (25 mg total) by mouth 2 (two) times daily.   sodium chloride flush 0.9 % Soln Commonly known as:  NS 5 mLs by Intracatheter route every 8 (eight) hours.        DISCHARGE INSTRUCTIONS:   Follow-up with Dr. Dahlia Byes this week Follow-up with PMD  If you experience worsening of your admission symptoms, develop shortness of breath, life threatening emergency, suicidal or homicidal thoughts you must seek medical attention immediately by calling 911 or calling your MD immediately  if symptoms less severe.  You Must read complete instructions/literature along with all the possible adverse reactions/side effects for all the Medicines you take and that have been prescribed to you. Take any new Medicines after you have completely understood and accept all the possible adverse reactions/side effects.   Please  note  You were cared for by a hospitalist during your hospital stay. If you have any questions about your discharge medications or the care you received while you were in the hospital after you are discharged, you can call the unit and asked to speak with the hospitalist on call if the hospitalist that took care of you is not available. Once you are discharged, your primary care physician will handle any further medical  issues. Please note that NO REFILLS for any discharge medications will be authorized once you are discharged, as it is imperative that you return to your primary care physician (or establish a relationship with a primary care physician if you do not have one) for your aftercare needs so that they can reassess your need for medications and monitor your lab values.    Today   CHIEF COMPLAINT:   Chief Complaint  Patient presents with  . Abdominal Pain    HISTORY OF PRESENT ILLNESS:  Taite Glotfelty  is a 72 y.o. female with a known history of hypertension presented with abdominal pain   VITAL SIGNS:  Blood pressure (!) 185/97, pulse 80, temperature 98.9 F (37.2 C), temperature source Oral, resp. rate 16, height 5\' 3"  (1.6 m), weight 77.1 kg, SpO2 100 %.   PHYSICAL EXAMINATION:  GENERAL:  72 y.o.-year-old patient lying in the bed with no acute distress.  EYES: Pupils equal, round, reactive to light and accommodation. No scleral icterus. Extraocular muscles intact.  HEENT: Head atraumatic, normocephalic. Oropharynx and nasopharynx clear.  NECK:  Supple, no jugular venous distention. No thyroid enlargement, no tenderness.  LUNGS: Normal breath sounds bilaterally, no wheezing, rales,rhonchi or crepitation. No use of accessory muscles of respiration.  CARDIOVASCULAR: S1, S2 normal. No murmurs, rubs, or gallops.  ABDOMEN: Soft, non-tender, non-distended. Bowel sounds present. No organomegaly or mass.  EXTREMITIES: No pedal edema, cyanosis, or clubbing.  NEUROLOGIC: Cranial nerves II through XII are intact. Muscle strength 5/5 in all extremities. Sensation intact. Gait not checked.  PSYCHIATRIC: The patient is alert and oriented x 3.  SKIN: No obvious rash, lesion, or ulcer.   DATA REVIEW:   CBC Recent Labs  Lab 11/22/17 0548  WBC 6.1  HGB 11.5*  HCT 33.5*  PLT 284    Chemistries  Recent Labs  Lab 11/18/17 1558  11/21/17 0707  NA 138   < > 139  K 3.7   < > 3.4*  CL 106   <  > 108  CO2 25   < > 24  GLUCOSE 111*   < > 91  BUN 10   < > 9  CREATININE 0.77   < > 0.77  CALCIUM 10.4*   < > 9.1  AST 15  --   --   ALT 17  --   --   ALKPHOS 94  --   --   BILITOT 0.7  --   --    < > = values in this interval not displayed.     Microbiology Results  Results for orders placed or performed during the hospital encounter of 11/18/17  Aerobic/Anaerobic Culture (surgical/deep wound)     Status: None (Preliminary result)   Collection Time: 11/20/17 11:39 AM  Result Value Ref Range Status   Specimen Description   Final    ABSCESS Performed at [facility], [address] [place], [place] [address]    Special Requests   Final    LEFT ABDOMINAL Performed at [facility], [place],  [place], [place] [address]    Gram Stain   Final    ABUNDANT WBC PRESENT, PREDOMINANTLY PMN FEW GRAM POSITIVE COCCI IN PAIRS AND CHAINS Performed at [facility], [place] [address] [place],  [address]    Culture   Final    ABUNDANT KLEBSIELLA PNEUMONIAE NO ANAEROBES ISOLATED; CULTURE IN PROGRESS FOR 5 DAYS    Report Status PENDING  Incomplete   Organism ID, Bacteria KLEBSIELLA PNEUMONIAE  Final      Susceptibility   Klebsiella pneumoniae - MIC*    AMPICILLIN >=32 RESISTANT Resistant     CEFAZOLIN <=4 SENSITIVE Sensitive     CEFEPIME <=1 SENSITIVE Sensitive     CEFTAZIDIME <=1 SENSITIVE Sensitive     CEFTRIAXONE <=1 SENSITIVE Sensitive     CIPROFLOXACIN <=0.25 SENSITIVE Sensitive     GENTAMICIN <=1 SENSITIVE Sensitive     IMIPENEM <=0.25 SENSITIVE Sensitive     TRIMETH/SULFA <=20 SENSITIVE Sensitive     AMPICILLIN/SULBACTAM 4 SENSITIVE Sensitive     PIP/TAZO <=4 SENSITIVE Sensitive     Extended ESBL NEGATIVE Sensitive     * ABUNDANT KLEBSIELLA PNEUMONIAE    RADIOLOGY:  Dg Abd 2 Views  Result Date: 11/21/2017 CLINICAL DATA:  Abdominal pain EXAM: ABDOMEN - 2 VIEW COMPARISON:  CT from 2 days ago and yesterday FINDINGS: Percutaneous drainage  catheter for diverticular abscess. The catheter appears to be in similar position when compared to CT fluoroscopy. The bowel gas pattern is normal. No concerning mass effect or gas collection. Lung bases are clear. IMPRESSION: 1. Normal bowel gas pattern. 2. Percutaneous drainage catheter in expected position when compared to CT fluoroscopy. Electronically Signed   By: Monte Fantasia M.D.   On: 11/21/2017 07:58     Management plans discussed with the patient, family and they are in agreement.  CODE STATUS:     Code Status Orders  (From admission, onward)         Start     Ordered   11/18/17 2029  Full code  Continuous     11/18/17 2028        Code Status History    This patient has a current code status but no historical code status.      TOTAL TIME TAKING CARE OF THIS PATIENT: 35 minutes.    Loletha Grayer M.D on 11/22/2017 at 7:36 PM  Between 7am to 6pm - Pager - [phone]  After 6pm go to [email or web address] - password [email or web address] [facility]  [employer] Office  [phone]  CC: Primary care physician; Casilda Carls, MD (Inactive)

## 2017-11-22 NOTE — Progress Notes (Signed)
IV abx finished. IV DCd, no complications. DC summary given to family. First dose of Augmentin pulled for patient to be taken at 2200 per MD instructions, since pharmacy is not open at this time.Education given using teach back method on family regarding JP drain care and dressing change. Demonstrated understanding. Will ff up with Dr. Dahlia Byes midweek. DC pt with family per WC.

## 2017-11-22 NOTE — Progress Notes (Signed)
Patient ID: Helen May, female   DOB: Jul 24, 1945, 72 y.o.   MRN: [id number]  Case discussed with Dr. Anselm Jungling the rounding physician on the phone.  That I am discharging this evening because of the circumstances.  Dr. Loletha Grayer

## 2017-11-22 NOTE — Progress Notes (Signed)
[place] [facility] [place] NAME: Helen May    MR#:  [id number]  DATE OF BIRTH:  Apr 05, 1945  SUBJECTIVE:   Patient presents to the hospital due to abdominal pain and noted to have acute diverticulitis with a diverticular abscess.    Seen by general surgery and also interventional radiology and status post IR guided diverticular abscess drain placed today.  Patient has some pus drainage from that JP drain.  Still having some abdominal pain. Much improved.  REVIEW OF SYSTEMS:    Review of Systems  Constitutional: Negative for chills and fever.  HENT: Negative for congestion and tinnitus.   Eyes: Negative for blurred vision and double vision.  Respiratory: Negative for cough, shortness of breath and wheezing.   Cardiovascular: Negative for chest pain, orthopnea and PND.  Gastrointestinal: Positive for abdominal pain. Negative for diarrhea, nausea and vomiting.  Genitourinary: Negative for dysuria and hematuria.  Neurological: Negative for dizziness, sensory change and focal weakness.  All other systems reviewed and are negative.   Nutrition: NPO Tolerating Diet: NO Tolerating PT: Await eval.    DRUG ALLERGIES:  No Known Allergies  VITALS:  Blood pressure (!) 162/92, pulse (!) 102, temperature 99.5 F (37.5 C), temperature source Oral, resp. rate 18, height 5\' 3"  (1.6 m), weight 77.1 kg, SpO2 93 %.  PHYSICAL EXAMINATION:   Physical Exam  GENERAL:  72 y.o.-year-old patient lying in bed in no acute distress.  EYES: Pupils equal, round, reactive to light and accommodation. No scleral icterus. Extraocular muscles intact.  HEENT: Head atraumatic, normocephalic. Oropharynx and nasopharynx clear.  NECK:  Supple, no jugular venous distention. No thyroid enlargement, no tenderness.  LUNGS: Normal breath sounds bilaterally, no wheezing, rales, rhonchi. No use of accessory muscles of respiration.  CARDIOVASCULAR: S1, S2 normal. No murmurs,  rubs, or gallops.  ABDOMEN: Soft, nontender, nondistended. Bowel sounds present. No organomegaly or mass. + Left abdomen JP drain in place with some pus colored drainage noted. EXTREMITIES: No cyanosis, clubbing or edema b/l.    NEUROLOGIC: Cranial nerves II through XII are intact. No focal Motor or sensory deficits b/l.   PSYCHIATRIC: The patient is alert and oriented x 3.  SKIN: No obvious rash, lesion, or ulcer.    LABORATORY PANEL:   CBC Recent Labs  Lab 11/22/17 0548  WBC 6.1  HGB 11.5*  HCT 33.5*  PLT 284   ------------------------------------------------------------------------------------------------------------------  Chemistries  Recent Labs  Lab 11/18/17 1558  11/21/17 0707  NA 138   < > 139  K 3.7   < > 3.4*  CL 106   < > 108  CO2 25   < > 24  GLUCOSE 111*   < > 91  BUN 10   < > 9  CREATININE 0.77   < > 0.77  CALCIUM 10.4*   < > 9.1  AST 15  --   --   ALT 17  --   --   ALKPHOS 94  --   --   BILITOT 0.7  --   --    < > = values in this interval not displayed.   ------------------------------------------------------------------------------------------------------------------  Cardiac Enzymes No results for input(s): TROPONINI in the last 168 hours. ------------------------------------------------------------------------------------------------------------------  RADIOLOGY:  Dg Abd 2 Views  Result Date: 11/21/2017 CLINICAL DATA:  Abdominal pain EXAM: ABDOMEN - 2 VIEW COMPARISON:  CT from 2 days ago and yesterday FINDINGS: Percutaneous drainage catheter for diverticular abscess. The catheter appears to be in similar position  when compared to CT fluoroscopy. The bowel gas pattern is normal. No concerning mass effect or gas collection. Lung bases are clear. IMPRESSION: 1. Normal bowel gas pattern. 2. Percutaneous drainage catheter in expected position when compared to CT fluoroscopy. Electronically Signed   By: Monte Fantasia M.D.   On: 11/21/2017 07:58   Ct  Image Guided Drainage By Percutaneous Catheter  Result Date: 11/20/2017 CLINICAL DATA:  Sigmoid diverticulitis with development of diverticular abscess. EXAM: CT GUIDED CATHETER DRAINAGE OF PERITONEAL DIVERTICULAR ABSCESS ANESTHESIA/SEDATION: 3.5 mg IV Versed 100 mcg IV Fentanyl Total Moderate Sedation Time:  31 minutes The patient's level of consciousness and physiologic status were continuously monitored during the procedure by Radiology nursing. PROCEDURE: The procedure, risks, benefits, and alternatives were explained to the patient. Questions regarding the procedure were encouraged and answered. The patient understands and consents to the procedure. A time out was performed prior to initiating the procedure. CT was performed through the pelvis in a supine position. The left abdominal wall was prepped with chlorhexidine in a sterile fashion, and a sterile drape was applied covering the operative field. A sterile gown and sterile gloves were used for the procedure. Local anesthesia was provided with 1% Lidocaine. Under CT guidance, an 18 gauge trocar needle was advanced into a left-sided pelvic diverticular abscess lying superior to the lumen of the distal sigmoid colon. After needle advancement, aspiration of fluid was performed. A guidewire was advanced into the collection. The tract was dilated and a 10 French percutaneous drainage catheter placed. A fluid sample was withdrawn and sent for culture analysis. The catheter was flushed with saline and connected to a suction bulb. It was secured at the skin with a Prolene retention suture and adhesive StatLock device. COMPLICATIONS: Intramuscular hemorrhage in the left rectus muscle. SIR level A: No therapy, no consequence. FINDINGS: Abscess abutting the sigmoid colon was targeted and measures approximately 4.5 cm in diameter. Aspiration yielded grossly purulent fluid. During the procedure after needle advancement, it became evident that there was some thickening  of the left abdominal wall rectus musculature. This reached a maximal thickness of 2.6 cm compared to 1.1 cm prior to needle advancement. Findings are consistent with intramuscular hemorrhage during the procedure. This will be followed clinically and a follow-up CT also performed once fluid drainage from the abscess cavity diminishes significantly. IMPRESSION: 1. Percutaneous catheter drainage of sigmoid diverticular abscess with placement of 10 French percutaneous drain. The drain is attached to suction bulb drainage. A sample of purulent fluid was sent for culture analysis. 2. The procedure was complicated by some intramuscular hemorrhage within the left rectus muscle. This did not result in significant pain at the time of drainage catheter placement. Electronically Signed   By: Aletta Edouard M.D.   On: 11/20/2017 13:43     ASSESSMENT AND PLAN:   72 year old female with past medical history of hypertension, hyperlipidemia, asthma, osteoarthritis who presented to the hospital due to abdominal pain and noted to have acute diverticulitis with abscess formation.  1.  Acute diverticulitis with abscess- this was initially diagnosed based on a CT done at [facility].  She was not satisfied with her care there and therefore presented to the ER here. -Repeat CT scan of the abdomen pelvis showed diverticulitis with a diverticular abscess, discussed with general surgery and the recommended IR guided drainage of the abscess.  Patient is status post percutaneous CT-guided drainage of the abscess with a JP drain placed. - Continue broad-spectrum IV antibiotics with Zosyn and further care  as per general surgery and interventional radiology. -started on liquid diet per surgery.  Continue supportive care with pain control, IV fluids, antiemetics. -Patient has a low-grade fever but white cell count is normal and she is hemodynamically stable.  2.  Essential hypertension- BP somewhat elevated today due to pain  - cont.  HCTZ, will add some PRN Hydralazine.  - decrease IV fluids.  All the records are reviewed and case discussed with Care Management/Social Worker. Management plans discussed with the patient, family and they are in agreement.  CODE STATUS: Full code  DVT Prophylaxis: Hep SQ  TOTAL TIME TAKING CARE OF THIS PATIENT: 30 minutes.   POSSIBLE D/C IN 2-3 DAYS, DEPENDING ON CLINICAL CONDITION.   Vaughan Basta M.D on 11/22/2017 at 7:53 AM  Between 7am to 6pm - Pager - [phone]  After 6pm go to [email or web address] - Proofreader  [employer] [facility]  Office  [phone]  CC: Primary care physician; Casilda Carls, MD (Inactive)

## 2017-11-25 ENCOUNTER — Ambulatory Visit (INDEPENDENT_AMBULATORY_CARE_PROVIDER_SITE_OTHER): Payer: Medicare Other | Admitting: Surgery

## 2017-11-25 ENCOUNTER — Encounter: Payer: Self-pay | Admitting: Surgery

## 2017-11-25 VITALS — BP 152/97 | HR 75 | Temp 97.7°F | Resp 18 | Ht 63.5 in | Wt 165.2 lb

## 2017-11-25 DIAGNOSIS — M25569 Pain in unspecified knee: Secondary | ICD-10-CM | POA: Insufficient documentation

## 2017-11-25 DIAGNOSIS — S8000XA Contusion of unspecified knee, initial encounter: Secondary | ICD-10-CM | POA: Insufficient documentation

## 2017-11-25 DIAGNOSIS — R29898 Other symptoms and signs involving the musculoskeletal system: Secondary | ICD-10-CM | POA: Insufficient documentation

## 2017-11-25 DIAGNOSIS — K5720 Diverticulitis of large intestine with perforation and abscess without bleeding: Secondary | ICD-10-CM

## 2017-11-25 DIAGNOSIS — R269 Unspecified abnormalities of gait and mobility: Secondary | ICD-10-CM | POA: Insufficient documentation

## 2017-11-25 DIAGNOSIS — M1710 Unilateral primary osteoarthritis, unspecified knee: Secondary | ICD-10-CM | POA: Insufficient documentation

## 2017-11-25 DIAGNOSIS — M25669 Stiffness of unspecified knee, not elsewhere classified: Secondary | ICD-10-CM | POA: Insufficient documentation

## 2017-11-25 DIAGNOSIS — M7550 Bursitis of unspecified shoulder: Secondary | ICD-10-CM | POA: Insufficient documentation

## 2017-11-25 DIAGNOSIS — M179 Osteoarthritis of knee, unspecified: Secondary | ICD-10-CM | POA: Insufficient documentation

## 2017-11-25 NOTE — Progress Notes (Signed)
Outpatient Surgical Follow Up  11/25/2017  Danielle W Hurlbutt is an 72 y.o. female.   Chief Complaint  Patient presents with  . New Patient (Initial Visit)    Diverticulitis    HPI: 72 year old female well-known to me with a recent history of diverticulitis and abscess requiring percutaneous drainage.  She has been doing well with antibiotics by mouth.  She denies any abdominal pain.  No fevers no chills.  There is some about 20 cc a day of cloudy fluid.  Past Medical History:  Diagnosis Date  . Arthritis   . Asthma   . Hyperlipidemia   . Hypertension   . Pneumonia     Past Surgical History:  Procedure Laterality Date  . Removal of Carotid Mass Right 15 years ago  . TUBAL LIGATION  1972    Family History  Problem Relation Age of Onset  . Cervical cancer Daughter   . Breast cancer Neg Hx     Social History:  reports that she quit smoking about 24 years ago. She quit after 20.00 years of use. She has never used smokeless tobacco. She reports that she does not drink alcohol or use drugs.  Allergies:  Allergies  Allergen Reactions  . Other     Medications reviewed.    ROS Full ROS performed and is otherwise negative other than what is stated in HPI   BP (!) 152/97   Pulse 75   Temp 97.7 F (36.5 C) (Temporal)   Resp 18   Ht 5' 3.5" (1.613 m)   Wt 165 lb 3.2 oz (74.9 kg)   BMI 28.80 kg/m   Physical Exam  Constitutional: She is oriented to person, place, and time. She appears well-developed and well-nourished. No distress.  Eyes: Right eye exhibits no discharge. Left eye exhibits no discharge. No scleral icterus.  Abdominal: Soft. She exhibits no mass. There is no tenderness. There is no rebound and no guarding. No hernia.  Drain in place w some seropurulent drainage, no peritonitis  Neurological: She is alert and oriented to person, place, and time. She displays normal reflexes. No cranial nerve deficit. She exhibits normal muscle tone. Coordination normal.   Skin: Skin is warm and dry. Capillary refill takes less than 2 seconds. She is not diaphoretic.  Psychiatric: She has a normal mood and affect. Her behavior is normal. Judgment and thought content normal.  Nursing note and vitals reviewed.   Assessment/Plan: Diverticular abscess responded to percutaneous drainage and antibiotic therapy.  She still having some murky fluid and I would like to wait at least 5 days before removing the catheter.  I will see her back next Monday and hopefully if there drainage has cleared we will remove the drain. No need for emergent surgical intervention at this time.  She will benefit from elective sigmoid colectomy as an outpatient basis and she will need a colonoscopy in about 6 to 8 weeks.  Caroleen Hamman, MD [facility] General Surgeon

## 2017-11-25 NOTE — Patient Instructions (Addendum)
Please continue to record the output from the drain.  Please finish all the Antibiotics.   Please see your follow up appointment on 11/30/17.

## 2017-11-27 LAB — AEROBIC/ANAEROBIC CULTURE W GRAM STAIN (SURGICAL/DEEP WOUND)

## 2017-11-27 LAB — AEROBIC/ANAEROBIC CULTURE (SURGICAL/DEEP WOUND)

## 2017-11-30 ENCOUNTER — Encounter: Payer: Self-pay | Admitting: Surgery

## 2017-11-30 ENCOUNTER — Ambulatory Visit: Payer: Medicare Other | Admitting: Surgery

## 2017-11-30 ENCOUNTER — Other Ambulatory Visit: Payer: Self-pay

## 2017-11-30 VITALS — BP 156/94 | HR 69 | Temp 97.5°F | Ht 63.5 in | Wt 165.0 lb

## 2017-11-30 DIAGNOSIS — K5720 Diverticulitis of large intestine with perforation and abscess without bleeding: Secondary | ICD-10-CM | POA: Diagnosis not present

## 2017-11-30 NOTE — Patient Instructions (Signed)
Patient needs to be scheduled for colonoscopy and follow up with Dr. Dahlia Byes in 2 months.

## 2017-12-01 ENCOUNTER — Encounter: Payer: Self-pay | Admitting: Surgery

## 2017-12-01 NOTE — Progress Notes (Signed)
Pt seen and examined Minimal Drain output No N/V. Taking PO. No pain  PE NAD Abd: soft, NT, drain scant serous output. Removed  A/p Diverticulitis w abscess s/p drain placement Doing well Colonoscopy in 6 wks F/u for surgical discussion in  2 months for sigmoid colectomy I spent 15 min in this encounter w > 50% spent in coordination and counseling of her care

## 2018-01-13 ENCOUNTER — Ambulatory Visit: Payer: Medicare Other | Admitting: Gastroenterology

## 2018-01-13 ENCOUNTER — Encounter: Payer: Self-pay | Admitting: Gastroenterology

## 2018-01-13 DIAGNOSIS — K5720 Diverticulitis of large intestine with perforation and abscess without bleeding: Secondary | ICD-10-CM

## 2018-02-01 ENCOUNTER — Encounter: Payer: Self-pay | Admitting: Surgery

## 2018-02-01 ENCOUNTER — Ambulatory Visit (INDEPENDENT_AMBULATORY_CARE_PROVIDER_SITE_OTHER): Payer: Medicare Other | Admitting: Surgery

## 2018-02-01 ENCOUNTER — Other Ambulatory Visit: Payer: Self-pay

## 2018-02-01 VITALS — BP 185/106 | HR 60 | Temp 97.7°F | Resp 15 | Ht 63.5 in | Wt 171.0 lb

## 2018-02-01 DIAGNOSIS — K5720 Diverticulitis of large intestine with perforation and abscess without bleeding: Secondary | ICD-10-CM

## 2018-02-01 NOTE — Progress Notes (Signed)
Outpatient Surgical Follow Up  02/01/2018  Helen May is an 72 y.o. female.   Chief Complaint  Patient presents with  . Follow-up     2 month f/u diverticulitis    HPI: Helen May is a 72 year old female well-known to me with a prior history of complicated diverticulitis with abscess requiring percutaneous drainage.  Did require inpatient care for 4 days and did develop a transient bacteremia during the hospitalization after she had the drain placed. She is doing fine.  She denies any abdominal pain.  No fevers no chills to is tolerating diet and she has normal bowel movements. She Has an appointment with Dr. Vicente Males for her colonoscopy in 6 weeks Denies any weight loss, no nausea no vomiting and no fevers  Past Medical History:  Diagnosis Date  . Arthritis   . Asthma   . Hyperlipidemia   . Hypertension   . Pneumonia     Past Surgical History:  Procedure Laterality Date  . Removal of Carotid Mass Right 15 years ago  . TUBAL LIGATION  1972    Family History  Problem Relation Age of Onset  . Cervical cancer Daughter   . Breast cancer Neg Hx     Social History:  reports that she quit smoking about 24 years ago. She quit after 20.00 years of use. She has never used smokeless tobacco. She reports that she does not drink alcohol or use drugs.  Allergies:  Allergies  Allergen Reactions  . Eggs Or Egg-Derived Products     Egg whites   . Other     Cigarette smoke     Medications reviewed.    ROS Full ROS performed and is otherwise negative other than what is stated in HPI   BP (!) 185/106   Pulse 60   Temp 97.7 F (36.5 C) (Temporal)   Resp 15   Ht 5' 3.5" (1.613 m)   Wt 171 lb (77.6 kg)   SpO2 98%   BMI 29.82 kg/m   Physical Exam  Constitutional: She is oriented to person, place, and time. She appears well-developed and well-nourished.  Neck: Normal range of motion. No JVD present. No tracheal deviation present. No thyromegaly present.  Cardiovascular:  Normal rate and regular rhythm.  Pulmonary/Chest: Effort normal and breath sounds normal. No stridor. No respiratory distress.  Abdominal: Soft. She exhibits no distension and no mass. There is no tenderness. There is no rebound and no guarding.  Musculoskeletal: Normal range of motion. She exhibits no edema or deformity.  Neurological: She is alert and oriented to person, place, and time. She displays normal reflexes. No cranial nerve deficit or sensory deficit. She exhibits normal muscle tone. Coordination normal.  Skin: Skin is warm.  Psychiatric: She has a normal mood and affect. Her behavior is normal. Judgment and thought content normal.  Nursing note and vitals reviewed.   Assessment/Plan:  1. Colonic diverticular abscess The patient is asymptomatic.  We had a lengthy discussion regarding the natural disease process of diverticulitis.  Specifically we talked about high-fiber diet.  We also discussed with her the evidence regarding seeds.  I was very honest and frank with her and told her that there is no evidence against recommending seeds.  What I do recommend is a high-fiber diet and healthy diet.  I will see her back in 8 weeks after she completes her colonoscopy and will discuss further about potential surgical therapy to include elective sigmoid colectomy. She seems a bit hesitant about having the  surgery performed but we will have another conversation with her. Only symptom free and no evidence of acute diverticulitis and no need for any immediate surgical intervention    Greater than 50% of the 25 minutes  visit was spent in counseling/coordination of care   Caroleen Hamman, MD [facility]

## 2018-02-01 NOTE — Patient Instructions (Addendum)
Patient is to return to the office to see Dr.Pabon after her colonoscopy it is scheduled for 03/23/2018. See your PCP for your hypertension.     Diverticulitis Diverticulitis is when small pockets in your large intestine (colon) get infected or swollen. This causes stomach pain and watery poop (diarrhea). These pouches are called diverticula. They form in people who have a condition called diverticulosis. Follow these instructions at home: Medicines  Take over-the-counter and prescription medicines only as told by your doctor. These include: ? Antibiotics. ? Pain medicines. ? Fiber pills. ? Probiotics. ? Stool softeners.  Do not drive or use heavy machinery while taking prescription pain medicine.  If you were prescribed an antibiotic, take it as told. Do not stop taking it even if you feel better. General instructions  Follow a diet as told by your doctor.  When you feel better, your doctor may tell you to change your diet. You may need to eat a lot of fiber. Fiber makes it easier to poop (have bowel movements). Healthy foods with fiber include: ? Berries. ? Beans. ? Lentils. ? Green vegetables.  Exercise 3 or more times a week. Aim for 30 minutes each time. Exercise enough to sweat and make your heart beat faster.  Keep all follow-up visits as told. This is important. You may need to have an exam of the large intestine. This is called a colonoscopy. Contact a doctor if:  Your pain does not get better.  You have a hard time eating or drinking.  You are not pooping like normal. Get help right away if:  Your pain gets worse.  Your problems do not get better.  Your problems get worse very fast.  You have a fever.  You throw up (vomit) more than one time.  You have poop that is: ? Bloody. ? Black. ? Tarry. Summary  Diverticulitis is when small pockets in your large intestine (colon) get infected or swollen.  Take medicines only as told by your doctor.  Follow  a diet as told by your doctor. This information is not intended to replace advice given to you by your health care provider. Make sure you discuss any questions you have with your health care provider. Document Released: 08/13/2007 Document Revised: 03/13/2016 Document Reviewed: 03/13/2016 Elsevier Interactive Patient Education  2017 [employer].     Hypertension Hypertension is another name for high blood pressure. High blood pressure forces your heart to work harder to pump blood. This can cause problems over time. There are two numbers in a blood pressure reading. There is a top number (systolic) over a bottom number (diastolic). It is best to have a blood pressure below 120/80. Healthy choices can help lower your blood pressure. You may need medicine to help lower your blood pressure if:  Your blood pressure cannot be lowered with healthy choices.  Your blood pressure is higher than 130/80.  Follow these instructions at home: Eating and drinking  If directed, follow the DASH eating plan. This diet includes: ? Filling half of your plate at each meal with fruits and vegetables. ? Filling one quarter of your plate at each meal with whole grains. Whole grains include whole wheat pasta, brown rice, and whole grain bread. ? Eating or drinking low-fat dairy products, such as skim milk or low-fat yogurt. ? Filling one quarter of your plate at each meal with low-fat (lean) proteins. Low-fat proteins include fish, skinless chicken, eggs, beans, and tofu. ? Avoiding fatty meat, cured and processed meat,  or chicken with skin. ? Avoiding premade or processed food.  Eat less than 1,500 mg of salt (sodium) a day.  Limit alcohol use to no more than 1 drink a day for nonpregnant women and 2 drinks a day for men. One drink equals 12 oz of beer, 5 oz of wine, or 1 oz of hard liquor. Lifestyle  Work with your doctor to stay at a healthy weight or to lose weight. Ask your doctor what the best weight  is for you.  Get at least 30 minutes of exercise that causes your heart to beat faster (aerobic exercise) most days of the week. This may include walking, swimming, or biking.  Get at least 30 minutes of exercise that strengthens your muscles (resistance exercise) at least 3 days a week. This may include lifting weights or pilates.  Do not use any products that contain nicotine or tobacco. This includes cigarettes and e-cigarettes. If you need help quitting, ask your doctor.  Check your blood pressure at home as told by your doctor.  Keep all follow-up visits as told by your doctor. This is important. Medicines  Take over-the-counter and prescription medicines only as told by your doctor. Follow directions carefully.  Do not skip doses of blood pressure medicine. The medicine does not work as well if you skip doses. Skipping doses also puts you at risk for problems.  Ask your doctor about side effects or reactions to medicines that you should watch for. Contact a doctor if:  You think you are having a reaction to the medicine you are taking.  You have headaches that keep coming back (recurring).  You feel dizzy.  You have swelling in your ankles.  You have trouble with your vision. Get help right away if:  You get a very bad headache.  You start to feel confused.  You feel weak or numb.  You feel faint.  You get very bad pain in your: ? Chest. ? Belly (abdomen).  You throw up (vomit) more than once.  You have trouble breathing. Summary  Hypertension is another name for high blood pressure.  Making healthy choices can help lower blood pressure. If your blood pressure cannot be controlled with healthy choices, you may need to take medicine. This information is not intended to replace advice given to you by your health care provider. Make sure you discuss any questions you have with your health care provider. Document Released: 08/13/2007 Document Revised: 01/23/2016  Document Reviewed: 01/23/2016 Elsevier Interactive Patient Education  [employer].

## 2018-03-23 ENCOUNTER — Ambulatory Visit: Payer: Medicare Other | Admitting: Gastroenterology

## 2018-03-23 ENCOUNTER — Encounter: Payer: Self-pay | Admitting: Gastroenterology

## 2018-03-23 DIAGNOSIS — K5720 Diverticulitis of large intestine with perforation and abscess without bleeding: Secondary | ICD-10-CM

## 2018-03-31 ENCOUNTER — Ambulatory Visit: Payer: Medicare Other | Admitting: Surgery

## 2018-10-20 ENCOUNTER — Encounter: Payer: Self-pay | Admitting: *Deleted

## 2018-10-22 ENCOUNTER — Ambulatory Visit: Payer: Medicare Other | Admitting: Podiatry

## 2019-01-14 ENCOUNTER — Encounter: Payer: Self-pay | Admitting: Internal Medicine

## 2019-01-20 ENCOUNTER — Encounter (INDEPENDENT_AMBULATORY_CARE_PROVIDER_SITE_OTHER): Payer: Self-pay

## 2019-01-20 ENCOUNTER — Other Ambulatory Visit: Payer: Self-pay

## 2019-01-20 ENCOUNTER — Ambulatory Visit: Payer: Medicare Other | Admitting: Gastroenterology

## 2019-01-20 ENCOUNTER — Encounter: Payer: Self-pay | Admitting: Gastroenterology

## 2019-01-20 VITALS — BP 164/80 | HR 73 | Temp 98.2°F | Ht 63.5 in | Wt 177.6 lb

## 2019-01-20 DIAGNOSIS — R748 Abnormal levels of other serum enzymes: Secondary | ICD-10-CM

## 2019-01-20 DIAGNOSIS — K5732 Diverticulitis of large intestine without perforation or abscess without bleeding: Secondary | ICD-10-CM

## 2019-01-20 NOTE — H&P (View-Only) (Signed)
Gastroenterology Consultation  Referring Provider:     Casilda Carls, MD Primary Care Physician:  Casilda Carls, MD Primary Gastroenterologist:  Dr. Allen Norris     Reason for Consultation:     Diverticulitis with abscess and increased alkaline phosphatase        HPI:   Helen May is a 73 y.o. y/o female referred for consultation & management of diverticulitis with abscess with increased alkaline phosphatase by Dr. Casilda Carls, MD.  This patient comes in today after missing multiple office visits in the past and was marked as no-show.  The patient had seen surgery back in September when she was admitted with diverticulitis with an abscess.  There is a drain placed by radiology and the patient improved.  The patient was then seen by surgery as an outpatient for follow-up and was recommended to have a colonoscopy.  The patient was then told that after the colonoscopy she would follow-up with surgery for possible resection.  The patient does not appear to have followed up with any those recommendations.  The patient's most recent lab work sent by her primary care provider showed an isolated increased alkaline phosphatase at 133 with a normal AST and ALT. The patients last colonoscopy was in 2009. She says she had an EGD at that time. The patient denies any unexplained weight loss fevers chills nausea or vomiting.  She also denies any alcohol abuse.  Past Medical History:  Diagnosis Date  . Arthritis   . Asthma   . Hyperlipidemia   . Hypertension   . Pneumonia     Past Surgical History:  Procedure Laterality Date  . Removal of Carotid Mass Right 15 years ago  . TUBAL LIGATION  1972    Prior to Admission medications   Medication Sig Start Date End Date Taking? Authorizing Provider  hydrochlorothiazide (HYDRODIURIL) 25 MG tablet Take 25 mg by mouth daily.    [provider]  meloxicam (MOBIC) 7.5 MG tablet meloxicam 7.5 mg tablet    [provider]  metoprolol  tartrate (LOPRESSOR) 25 MG tablet Take 1 tablet (25 mg total) by mouth 2 (two) times daily. 11/22/17   Loletha Grayer, MD  olopatadine (PATANOL) 0.1 % ophthalmic solution  01/07/18   [provider]  sodium chloride flush (NS) 0.9 % SOLN 5 mLs by Intracatheter route every 8 (eight) hours. 11/22/17   Loletha Grayer, MD    Family History  Problem Relation Age of Onset  . Cervical cancer Daughter   . Breast cancer Neg Hx      Social History   Tobacco Use  . Smoking status: Former Smoker    Years: 20.00    Quit date: 03/10/1993    Years since quitting: 25.8  . Smokeless tobacco: Never Used  Substance Use Topics  . Alcohol use: No    Alcohol/week: 0.0 standard drinks  . Drug use: No    Allergies as of 01/20/2019 - Review Complete 02/01/2018  Allergen Reaction Noted  . Eggs or egg-derived products  02/01/2018  . Other  11/25/2017    Review of Systems:    All systems reviewed and negative except where noted in HPI.   Physical Exam:  There were no vitals taken for this visit. No LMP recorded. Patient is postmenopausal. General:   Alert,  Well-developed, well-nourished, pleasant and cooperative in NAD Head:  Normocephalic and atraumatic. Eyes:  Sclera clear, no icterus.   Conjunctiva pink. Ears:  Normal auditory acuity. Neck:  Supple; no masses  or thyromegaly. Lungs:  Respirations even and unlabored.  Clear throughout to auscultation.   No wheezes, crackles, or rhonchi. No acute distress. Heart:  Regular rate and rhythm; no murmurs, clicks, rubs, or gallops. Abdomen:  Normal bowel sounds.  No bruits.  Soft, non-tender and non-distended without masses, hepatosplenomegaly or hernias noted.  No guarding or rebound tenderness.  Negative Carnett sign.   Rectal:  Deferred.  Msk:  Symmetrical without gross deformities.  Good, equal movement & strength bilaterally. Pulses:  Normal pulses noted. Extremities:  No clubbing or edema.  No cyanosis. Neurologic:  Alert and oriented  x3;  grossly normal neurologically. Skin:  Intact without significant lesions or rashes.  No jaundice. Lymph Nodes:  No significant cervical adenopathy. Psych:  Alert and cooperative. Normal mood and affect.  Imaging Studies: No results found.  Assessment and Plan:   [name] is a 73 y.o. y/o female who comes in with a history of diverticulitis with an abscess back last year and had not followed up after that.  The patient also has an isolated increased alkaline phosphatase.  She will also have her blood checked for antimitochondrial antibody.  The patient will have her liver enzymes rechecked and fractionation of the alkaline phosphatase.  The patient will also be set up for colonoscopy to rule out any pathology as a contributor to her diverticulitis.  The patient will then follow-up with surgery after the colonoscopy to discuss whether surgery will be needed. I have discussed risks & benefits which include, but are not limited to, bleeding, infection, perforation & drug reaction.  The patient agrees with this plan & written consent will be obtained.       Lucilla Lame, MD. [facility]    Note: This dictation was prepared with Dragon dictation along with smaller phrase technology. Any transcriptional errors that result from this process are unintentional.

## 2019-01-20 NOTE — Progress Notes (Signed)
Gastroenterology Consultation  Referring Provider:     Casilda Carls, MD Primary Care Physician:  Casilda Carls, MD Primary Gastroenterologist:  Dr. Allen Norris     Reason for Consultation:     Diverticulitis with abscess and increased alkaline phosphatase        HPI:   Helen May is a 73 y.o. y/o female referred for consultation & management of diverticulitis with abscess with increased alkaline phosphatase by Dr. Casilda Carls, MD.  This patient comes in today after missing multiple office visits in the past and was marked as no-show.  The patient had seen surgery back in September when she was admitted with diverticulitis with an abscess.  There is a drain placed by radiology and the patient improved.  The patient was then seen by surgery as an outpatient for follow-up and was recommended to have a colonoscopy.  The patient was then told that after the colonoscopy she would follow-up with surgery for possible resection.  The patient does not appear to have followed up with any those recommendations.  The patient's most recent lab work sent by her primary care provider showed an isolated increased alkaline phosphatase at 133 with a normal AST and ALT. The patients last colonoscopy was in 2009. She says she had an EGD at that time. The patient denies any unexplained weight loss fevers chills nausea or vomiting.  She also denies any alcohol abuse.  Past Medical History:  Diagnosis Date  . Arthritis   . Asthma   . Hyperlipidemia   . Hypertension   . Pneumonia     Past Surgical History:  Procedure Laterality Date  . Removal of Carotid Mass Right 15 years ago  . TUBAL LIGATION  1972    Prior to Admission medications   Medication Sig Start Date End Date Taking? Authorizing Provider  hydrochlorothiazide (HYDRODIURIL) 25 MG tablet Take 25 mg by mouth daily.    [provider]  meloxicam (MOBIC) 7.5 MG tablet meloxicam 7.5 mg tablet    [provider]  metoprolol  tartrate (LOPRESSOR) 25 MG tablet Take 1 tablet (25 mg total) by mouth 2 (two) times daily. 11/22/17   Loletha Grayer, MD  olopatadine (PATANOL) 0.1 % ophthalmic solution  01/07/18   [provider]  sodium chloride flush (NS) 0.9 % SOLN 5 mLs by Intracatheter route every 8 (eight) hours. 11/22/17   Loletha Grayer, MD    Family History  Problem Relation Age of Onset  . Cervical cancer Daughter   . Breast cancer Neg Hx      Social History   Tobacco Use  . Smoking status: Former Smoker    Years: 20.00    Quit date: 03/10/1993    Years since quitting: 25.8  . Smokeless tobacco: Never Used  Substance Use Topics  . Alcohol use: No    Alcohol/week: 0.0 standard drinks  . Drug use: No    Allergies as of 01/20/2019 - Review Complete 02/01/2018  Allergen Reaction Noted  . Eggs or egg-derived products  02/01/2018  . Other  11/25/2017    Review of Systems:    All systems reviewed and negative except where noted in HPI.   Physical Exam:  There were no vitals taken for this visit. No LMP recorded. Patient is postmenopausal. General:   Alert,  Well-developed, well-nourished, pleasant and cooperative in NAD Head:  Normocephalic and atraumatic. Eyes:  Sclera clear, no icterus.   Conjunctiva pink. Ears:  Normal auditory acuity. Neck:  Supple; no masses  or thyromegaly. Lungs:  Respirations even and unlabored.  Clear throughout to auscultation.   No wheezes, crackles, or rhonchi. No acute distress. Heart:  Regular rate and rhythm; no murmurs, clicks, rubs, or gallops. Abdomen:  Normal bowel sounds.  No bruits.  Soft, non-tender and non-distended without masses, hepatosplenomegaly or hernias noted.  No guarding or rebound tenderness.  Negative Carnett sign.   Rectal:  Deferred.  Msk:  Symmetrical without gross deformities.  Good, equal movement & strength bilaterally. Pulses:  Normal pulses noted. Extremities:  No clubbing or edema.  No cyanosis. Neurologic:  Alert and oriented  x3;  grossly normal neurologically. Skin:  Intact without significant lesions or rashes.  No jaundice. Lymph Nodes:  No significant cervical adenopathy. Psych:  Alert and cooperative. Normal mood and affect.  Imaging Studies: No results found.  Assessment and Plan:   Helen May is a 73 y.o. y/o female who comes in with a history of diverticulitis with an abscess back last year and had not followed up after that.  The patient also has an isolated increased alkaline phosphatase.  She will also have her blood checked for antimitochondrial antibody.  The patient will have her liver enzymes rechecked and fractionation of the alkaline phosphatase.  The patient will also be set up for colonoscopy to rule out any pathology as a contributor to her diverticulitis.  The patient will then follow-up with surgery after the colonoscopy to discuss whether surgery will be needed. I have discussed risks & benefits which include, but are not limited to, bleeding, infection, perforation & drug reaction.  The patient agrees with this plan & written consent will be obtained.       Lucilla Lame, MD. [facility]    Note: This dictation was prepared with Dragon dictation along with smaller phrase technology. Any transcriptional errors that result from this process are unintentional.

## 2019-01-25 LAB — HEPATIC FUNCTION PANEL
ALT: 9 IU/L (ref 0–32)
AST: 16 IU/L (ref 0–40)
Albumin: 4.4 g/dL (ref 3.7–4.7)
Alkaline Phosphatase: 136 IU/L — ABNORMAL HIGH (ref 39–117)
Bilirubin Total: 0.5 mg/dL (ref 0.0–1.2)
Bilirubin, Direct: 0.17 mg/dL (ref 0.00–0.40)
Total Protein: 7.4 g/dL (ref 6.0–8.5)

## 2019-01-25 LAB — MITOCHONDRIAL ANTIBODIES: Mitochondrial Ab: <20 Units (ref 0.0–20.0)

## 2019-01-25 LAB — ALKALINE PHOSPHATASE, ISOENZYMES
BONE FRACTION: 41 % (ref 14–68)
INTESTINAL FRAC.: 0 % (ref 0–18)
LIVER FRACTION: 59 % (ref 18–85)

## 2019-01-27 ENCOUNTER — Telehealth: Payer: Self-pay

## 2019-01-27 ENCOUNTER — Other Ambulatory Visit: Payer: Self-pay

## 2019-01-27 DIAGNOSIS — R748 Abnormal levels of other serum enzymes: Secondary | ICD-10-CM

## 2019-01-27 NOTE — Telephone Encounter (Signed)
Pt has been notified of lab results. Pt has also been schedule for a RUQ abdominal US at [facility] outpatient imaging, [facility] location on Wednesday, Nov 25th at 8:45am. Pt has been advised of the appointment information as well as prep instructions.

## 2019-01-27 NOTE — Telephone Encounter (Signed)
LVM for pt to return my call.

## 2019-01-27 NOTE — Telephone Encounter (Signed)
-----   Message from Lucilla Lame, MD sent at 01/27/2019  7:12 AM EST ----- Let the patient know that the alkaline phosphatase was slightly elevated again and that her alkaline phosphatase majority is from the liver.  The next step in the work-up would be an ultrasound of the right upper quadrant to look for dilated bile ducts.  If the ultrasound is normal then we will just observe it every 6 months to make sure it is not going up.

## 2019-02-02 ENCOUNTER — Ambulatory Visit
Admission: RE | Admit: 2019-02-02 | Discharge: 2019-02-02 | Disposition: A | Discharge status: Home / Self Care | Payer: Medicare Other | Source: Ambulatory Visit | Attending: Gastroenterology | Admitting: Gastroenterology

## 2019-02-02 ENCOUNTER — Other Ambulatory Visit: Payer: Self-pay

## 2019-02-02 DIAGNOSIS — R748 Abnormal levels of other serum enzymes: Secondary | ICD-10-CM | POA: Diagnosis present

## 2019-02-08 ENCOUNTER — Telehealth: Payer: Self-pay

## 2019-02-08 NOTE — Telephone Encounter (Signed)
-----   Message from Lucilla Lame, MD sent at 02/07/2019 11:58 AM EST ----- The patient know that the ultrasound did not show any dilation of her common bile duct or abnormality of the liver.  Because the elevation is only slightly elevated the recommendations at this point is to follow-up with a repeat alkaline phosphatase in 6 months.

## 2019-02-08 NOTE — Telephone Encounter (Signed)
Pt notified of US results.  

## 2019-02-11 ENCOUNTER — Other Ambulatory Visit
Admission: RE | Admit: 2019-02-11 | Discharge: 2019-02-11 | Disposition: A | Discharge status: Home / Self Care | Payer: Medicare Other | Source: Ambulatory Visit | Attending: Gastroenterology | Admitting: Gastroenterology

## 2019-02-11 DIAGNOSIS — Z01812 Encounter for preprocedural laboratory examination: Secondary | ICD-10-CM | POA: Diagnosis present

## 2019-02-11 DIAGNOSIS — Z20828 Contact with and (suspected) exposure to other viral communicable diseases: Secondary | ICD-10-CM | POA: Diagnosis not present

## 2019-02-11 LAB — SARS CORONAVIRUS 2 (TAT 6-24 HRS): SARS Coronavirus 2: NEGATIVE

## 2019-02-15 ENCOUNTER — Ambulatory Visit: Payer: Medicare Other | Admitting: Certified Registered Nurse Anesthetist

## 2019-02-15 ENCOUNTER — Encounter
Admission: RE | Disposition: A | Discharge status: Home / Self Care | Payer: Self-pay | Source: Home / Self Care | Attending: Gastroenterology

## 2019-02-15 ENCOUNTER — Ambulatory Visit
Admission: RE | Admit: 2019-02-15 | Discharge: 2019-02-15 | Disposition: A | Discharge status: Home / Self Care | Payer: Medicare Other | Attending: Gastroenterology | Admitting: Gastroenterology

## 2019-02-15 ENCOUNTER — Encounter: Payer: Self-pay | Admitting: Emergency Medicine

## 2019-02-15 DIAGNOSIS — Z79899 Other long term (current) drug therapy: Secondary | ICD-10-CM | POA: Diagnosis not present

## 2019-02-15 DIAGNOSIS — K5730 Diverticulosis of large intestine without perforation or abscess without bleeding: Secondary | ICD-10-CM | POA: Diagnosis not present

## 2019-02-15 DIAGNOSIS — K640 First degree hemorrhoids: Secondary | ICD-10-CM | POA: Insufficient documentation

## 2019-02-15 DIAGNOSIS — Z87891 Personal history of nicotine dependence: Secondary | ICD-10-CM | POA: Diagnosis not present

## 2019-02-15 DIAGNOSIS — Z791 Long term (current) use of non-steroidal anti-inflammatories (NSAID): Secondary | ICD-10-CM | POA: Diagnosis not present

## 2019-02-15 DIAGNOSIS — K635 Polyp of colon: Secondary | ICD-10-CM | POA: Diagnosis not present

## 2019-02-15 DIAGNOSIS — K5732 Diverticulitis of large intestine without perforation or abscess without bleeding: Secondary | ICD-10-CM

## 2019-02-15 DIAGNOSIS — Z1211 Encounter for screening for malignant neoplasm of colon: Secondary | ICD-10-CM | POA: Diagnosis not present

## 2019-02-15 DIAGNOSIS — Z09 Encounter for follow-up examination after completed treatment for conditions other than malignant neoplasm: Secondary | ICD-10-CM | POA: Insufficient documentation

## 2019-02-15 DIAGNOSIS — I10 Essential (primary) hypertension: Secondary | ICD-10-CM | POA: Insufficient documentation

## 2019-02-15 HISTORY — PX: COLONOSCOPY WITH PROPOFOL: SHX5780

## 2019-02-15 SURGERY — COLONOSCOPY WITH PROPOFOL
Anesthesia: General

## 2019-02-15 MED ORDER — PROPOFOL 10 MG/ML IV BOLUS
INTRAVENOUS | Status: DC | PRN
  Administered 2019-02-15 (×2): 10 mg via INTRAVENOUS
  Administered 2019-02-15: 50 mg via INTRAVENOUS

## 2019-02-15 MED ORDER — LIDOCAINE HCL (CARDIAC) PF 100 MG/5ML IV SOSY
PREFILLED_SYRINGE | INTRAVENOUS | Status: DC | PRN
  Administered 2019-02-15: 70 mg via INTRAVENOUS

## 2019-02-15 MED ORDER — SODIUM CHLORIDE 0.9 % IV SOLN
INTRAVENOUS | Status: DC
  Administered 2019-02-15: 1000 mL via INTRAVENOUS

## 2019-02-15 MED ORDER — PROPOFOL 500 MG/50ML IV EMUL
INTRAVENOUS | Status: DC | PRN
  Administered 2019-02-15: 140 ug/kg/min via INTRAVENOUS

## 2019-02-15 NOTE — Anesthesia Preprocedure Evaluation (Signed)
Anesthesia Evaluation  Patient identified by MRN, date of birth, ID band Patient awake    Reviewed: Allergy & Precautions, NPO status , Patient's Chart, lab work & pertinent test results  History of Anesthesia Complications Negative for: history of anesthetic complications  Airway Mallampati: II  TM Distance: >3 FB Neck ROM: Full    Dental  (+) Upper Dentures, Lower Dentures   Pulmonary asthma , former smoker,    breath sounds clear to auscultation- rhonchi (-) wheezing      Cardiovascular hypertension, Pt. on medications (-) CAD, (-) Past MI, (-) Cardiac Stents and (-) CABG  Rhythm:Regular Rate:Normal - Systolic murmurs and - Diastolic murmurs    Neuro/Psych neg Seizures negative neurological ROS  negative psych ROS   GI/Hepatic negative GI ROS, Neg liver ROS,   Endo/Other  negative endocrine ROSneg diabetes  Renal/GU negative Renal ROS     Musculoskeletal  (+) Arthritis ,   Abdominal (+) + obese,   Peds  Hematology negative hematology ROS (+)   Anesthesia Other Findings Past Medical History: No date: Arthritis No date: Asthma No date: Hyperlipidemia No date: Hypertension No date: Pneumonia   Reproductive/Obstetrics                             Anesthesia Physical Anesthesia Plan  ASA: II  Anesthesia Plan: General   Post-op Pain Management:    Induction: Intravenous  PONV Risk Score and Plan: 2 and Propofol infusion  Airway Management Planned: Natural Airway  Additional Equipment:   Intra-op Plan:   Post-operative Plan:   Informed Consent: I have reviewed the patients History and Physical, chart, labs and discussed the procedure including the risks, benefits and alternatives for the proposed anesthesia with the patient or authorized representative who has indicated his/her understanding and acceptance.     Dental advisory given  Plan Discussed with: CRNA and  Anesthesiologist  Anesthesia Plan Comments:         Anesthesia Quick Evaluation

## 2019-02-15 NOTE — Anesthesia Postprocedure Evaluation (Signed)
Anesthesia Post Note  Patient: Helen May  Procedure(s) Performed: COLONOSCOPY WITH PROPOFOL (N/A )  Patient location during evaluation: Endoscopy Anesthesia Type: General Level of consciousness: awake and alert and oriented Pain management: pain level controlled Vital Signs Assessment: post-procedure vital signs reviewed and stable Respiratory status: spontaneous breathing, nonlabored ventilation and respiratory function stable Cardiovascular status: blood pressure returned to baseline and stable Postop Assessment: no signs of nausea or vomiting Anesthetic complications: no     Last Vitals:  Vitals:   02/15/19 1148 02/15/19 1149  BP: 132/71 132/71  Pulse: 65 63  Resp: 17 17  Temp:    SpO2: 100% 100%    Last Pain:  Vitals:   02/15/19 1148  TempSrc:   PainSc: 0-No pain                 Ludie Pavlik

## 2019-02-15 NOTE — Op Note (Signed)
[facility] Patient Name: Helen May Procedure Date: 02/15/2019 11:09 AM MRN: [id number] Account #: [id number] Date of Birth: March 29, 1945 Admit Type: Outpatient Age: 73 Room: [facility] ENDO ROOM 4 Gender: Female Note Status: Finalized Procedure:             Colonoscopy Indications:           Screening for colorectal malignant neoplasm Providers:             Lucilla Lame MD, MD Referring MD:          Casilda Carls, MD (Referring MD) Medicines:             Propofol per Anesthesia Complications:         No immediate complications. Procedure:             Pre-Anesthesia Assessment:                        - Prior to the procedure, a History and Physical was                         performed, and patient medications and allergies were                         reviewed. The patient's tolerance of previous                         anesthesia was also reviewed. The risks and benefits                         of the procedure and the sedation options and risks                         were discussed with the patient. All questions were                         answered, and informed consent was obtained. Prior                         Anticoagulants: The patient has taken no previous                         anticoagulant or antiplatelet agents. ASA Grade                         Assessment: II - A patient with mild systemic disease.                         After reviewing the risks and benefits, the patient                         was deemed in satisfactory condition to undergo the                         procedure.                        After obtaining informed consent, the colonoscope was  passed under direct vision. Throughout the procedure,                         the patient's blood pressure, pulse, and oxygen                         saturations were monitored continuously. The                         Colonoscope was introduced through  the anus and                         advanced to the the cecum, identified by appendiceal                         orifice and ileocecal valve. The colonoscopy was                         performed without difficulty. The patient tolerated                         the procedure well. The quality of the bowel                         preparation was excellent. Findings:      The perianal and digital rectal examinations were normal.      A 2 mm polyp was found in the transverse colon. The polyp was sessile.       The polyp was removed with a cold biopsy forceps. Resection and       retrieval were complete.      A 3 mm polyp was found in the transverse colon. The polyp was sessile.       The polyp was removed with a cold biopsy forceps. Resection and       retrieval were complete.      Non-bleeding internal hemorrhoids were found during retroflexion. The       hemorrhoids were Grade I (internal hemorrhoids that do not prolapse).      A few small-mouthed diverticula were found in the sigmoid colon. Impression:            - One 2 mm polyp in the transverse colon, removed with                         a cold biopsy forceps. Resected and retrieved.                        - One 3 mm polyp in the transverse colon, removed with                         a cold biopsy forceps. Resected and retrieved.                        - Non-bleeding internal hemorrhoids.                        - Diverticulosis in the sigmoid colon. Recommendation:        - Discharge patient to home.                        -  Resume previous diet.                        - Continue present medications.                        - Await pathology results.                        - Repeat colonoscopy in 5 years if polyp adenoma and                         10 years if hyperplastic Procedure Code(s):     --- Professional ---                        [phone], Colonoscopy, flexible; with biopsy, single or                          multiple Diagnosis Code(s):     --- Professional ---                        Z12.11, Encounter for screening for malignant neoplasm                         of colon                        K63.5, Polyp of colon CPT copyright 2019 American Medical Association. All rights reserved. The codes documented in this report are preliminary and upon coder review may  be revised to meet current compliance requirements. Lucilla Lame MD, MD 02/15/2019 11:27:39 AM This report has been signed electronically. Number of Addenda: 0 Note Initiated On: 02/15/2019 11:09 AM Scope Withdrawal Time: 0 hours 8 minutes 30 seconds  Total Procedure Duration: 0 hours 11 minutes 7 seconds  Estimated Blood Loss:  Estimated blood loss: none.      [facility]

## 2019-02-15 NOTE — Transfer of Care (Signed)
Immediate Anesthesia Transfer of Care Note  Patient: Helen May  Procedure(s) Performed: COLONOSCOPY WITH PROPOFOL (N/A )  Patient Location: PACU  Anesthesia Type:General  Level of Consciousness: drowsy and patient cooperative  Airway & Oxygen Therapy: Patient Spontanous Breathing and Patient connected to nasal cannula oxygen  Post-op Assessment: Report given to RN and Post -op Vital signs reviewed and stable  Post vital signs: Reviewed and stable  Last Vitals:  Vitals Value Taken Time  BP    Temp    Pulse 71 02/15/19 1129  Resp 23 02/15/19 1129  SpO2 99 % 02/15/19 1129  Vitals shown include unvalidated device data.  Last Pain:  Vitals:   02/15/19 1028  TempSrc: Temporal  PainSc: 0-No pain         Complications: No apparent anesthesia complications

## 2019-02-15 NOTE — Anesthesia Post-op Follow-up Note (Signed)
Anesthesia QCDR form completed.        

## 2019-02-15 NOTE — Interval H&P Note (Signed)
History and Physical Interval Note:  02/15/2019 11:05 AM  Helen May  has presented today for surgery, with the diagnosis of Hx of diverticulitis Z87.19.  The various methods of treatment have been discussed with the patient and family. After consideration of risks, benefits and other options for treatment, the patient has consented to  Procedure(s): COLONOSCOPY WITH PROPOFOL (N/A) as a surgical intervention.  The patient's history has been reviewed, patient examined, no change in status, stable for surgery.  I have reviewed the patient's chart and labs.  Questions were answered to the patient's satisfaction.     Brittanya Winburn [employer]

## 2019-02-16 ENCOUNTER — Encounter: Payer: Self-pay | Admitting: Gastroenterology

## 2019-02-16 LAB — SURGICAL PATHOLOGY

## 2019-02-23 ENCOUNTER — Ambulatory Visit: Payer: Medicare Other | Admitting: Gastroenterology

## 2019-04-21 DIAGNOSIS — H40013 Open angle with borderline findings, low risk, bilateral: Secondary | ICD-10-CM | POA: Diagnosis not present

## 2019-04-21 DIAGNOSIS — H04129 Dry eye syndrome of unspecified lacrimal gland: Secondary | ICD-10-CM | POA: Diagnosis not present

## 2019-04-21 DIAGNOSIS — Z961 Presence of intraocular lens: Secondary | ICD-10-CM | POA: Diagnosis not present

## 2019-04-21 DIAGNOSIS — H2512 Age-related nuclear cataract, left eye: Secondary | ICD-10-CM | POA: Diagnosis not present

## 2019-04-22 ENCOUNTER — Ambulatory Visit: Payer: Medicare Other | Attending: Internal Medicine

## 2019-04-22 ENCOUNTER — Other Ambulatory Visit: Payer: Self-pay

## 2019-04-22 DIAGNOSIS — Z23 Encounter for immunization: Secondary | ICD-10-CM | POA: Insufficient documentation

## 2019-04-22 NOTE — Progress Notes (Signed)
   Covid-19 Vaccination Clinic  Name:  Helen May    MRN: [id number] DOB: [birth year]  04/22/2019  [name] was observed post Covid-19 immunization for 15 minutes without incidence. She was provided with Vaccine Information Sheet and instruction to access the V-Safe system.   [name] was instructed to call 911 with any severe reactions post vaccine: Marland Kitchen Difficulty breathing  . Swelling of your face and throat  . A fast heartbeat  . A bad rash all over your body  . Dizziness and weakness    Immunizations Administered    Name Date Dose VIS Date Route   Moderna COVID-19 Vaccine 04/22/2019 11:05 AM 0.5 mL 02/08/2019 Intramuscular   Manufacturer: Moderna   Lot: [id number]   [place]:[id number]

## 2019-05-19 ENCOUNTER — Ambulatory Visit: Payer: Medicare Other | Attending: Internal Medicine

## 2019-05-20 ENCOUNTER — Ambulatory Visit: Payer: Medicare Other | Attending: Internal Medicine

## 2019-05-20 DIAGNOSIS — Z20822 Contact with and (suspected) exposure to covid-19: Secondary | ICD-10-CM

## 2019-05-21 LAB — NOVEL CORONAVIRUS, NAA: SARS-CoV-2, NAA: NOT DETECTED

## 2019-05-23 ENCOUNTER — Ambulatory Visit: Payer: Medicare Other | Attending: Internal Medicine

## 2019-05-23 DIAGNOSIS — Z23 Encounter for immunization: Secondary | ICD-10-CM

## 2019-05-23 NOTE — Progress Notes (Signed)
   Covid-19 Vaccination Clinic  Name:  Helen May    MRN: [id number] DOB: 1945-06-24  05/23/2019  [name] was observed post Covid-19 immunization for 15 minutes without incident. She was provided with Vaccine Information Sheet and instruction to access the V-Safe system.   [name] was instructed to call 911 with any severe reactions post vaccine: Marland Kitchen Difficulty breathing  . Swelling of face and throat  . A fast heartbeat  . A bad rash all over body  . Dizziness and weakness   Immunizations Administered    Name Date Dose VIS Date Route   Moderna COVID-19 Vaccine 05/23/2019 11:19 AM 0.5 mL 02/08/2019 Intramuscular   Manufacturer: Moderna   Lot: [id number]   [place]:[id number]

## 2019-06-16 DIAGNOSIS — I10 Essential (primary) hypertension: Secondary | ICD-10-CM | POA: Diagnosis not present

## 2019-06-16 DIAGNOSIS — E559 Vitamin D deficiency, unspecified: Secondary | ICD-10-CM | POA: Diagnosis not present

## 2019-06-16 DIAGNOSIS — E785 Hyperlipidemia, unspecified: Secondary | ICD-10-CM | POA: Diagnosis not present

## 2019-06-16 DIAGNOSIS — K219 Gastro-esophageal reflux disease without esophagitis: Secondary | ICD-10-CM | POA: Diagnosis not present

## 2019-06-16 DIAGNOSIS — R5383 Other fatigue: Secondary | ICD-10-CM | POA: Diagnosis not present

## 2019-08-11 ENCOUNTER — Other Ambulatory Visit: Payer: Self-pay

## 2019-08-11 ENCOUNTER — Telehealth: Payer: Self-pay

## 2019-08-11 DIAGNOSIS — R748 Abnormal levels of other serum enzymes: Secondary | ICD-10-CM

## 2019-08-11 NOTE — Telephone Encounter (Signed)
Left vm informing pt she is due for her repeat LFT's to recheck her alkaline phosphatase. Advised pt if she has had recent labs with her PCP, Dr. Allen Norris would like these labs faxed to our office. If not, we are requesting her to either come to our office for labs or do to any [facility] draw station. Lab order has also been mailed.

## 2019-08-11 NOTE — Telephone Encounter (Signed)
-----   Message from [place], [place] sent at 02/08/2019  4:02 PM EST ----- Pt will need repeat LFT's to recheck alkaline phosp

## 2019-08-19 LAB — HEPATIC FUNCTION PANEL
ALT: 12 IU/L (ref 0–32)
AST: 15 IU/L (ref 0–40)
Albumin: 4.5 g/dL (ref 3.7–4.7)
Alkaline Phosphatase: 130 IU/L — ABNORMAL HIGH (ref 48–121)
Bilirubin Total: 0.3 mg/dL (ref 0.0–1.2)
Bilirubin, Direct: 0.13 mg/dL (ref 0.00–0.40)
Total Protein: 7.4 g/dL (ref 6.0–8.5)

## 2019-08-26 ENCOUNTER — Telehealth: Payer: Self-pay

## 2019-08-26 NOTE — Telephone Encounter (Signed)
-----   Message from Lucilla Lame, MD sent at 08/25/2019  5:17 PM EDT ----- Let the patient know that the alkaline phosphatase went down slightly from the last time she had it checked but is still not high enough to warrant any further worry or investigation.

## 2019-08-26 NOTE — Telephone Encounter (Signed)
Pt notified of results

## 2019-12-30 IMAGING — CT CT ABD-PELV W/O CM
2 of 7 series · 15 of 46 positions shown, 17 images · non-contrast
Comparison: 11/18/2017, CT 01/11/2013

CLINICAL DATA: Diffuse lower abdominal pain previous CT
demonstrating abscess

EXAM:
CT ABDOMEN AND PELVIS WITHOUT CONTRAST
TECHNIQUE: Multidetector CT imaging of the abdomen and pelvis was performed
following the standard protocol without IV contrast.

[Series 2: routine abd/pel wo · axial · 0.64mm/px · z∈[-72,+348]mm · 12 of 92 slices shown, 14 images]
[im 4/92  soft-tissue]
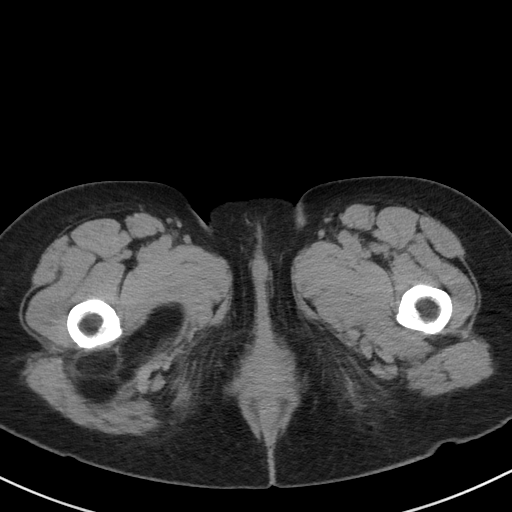
[im 4/92  bone]
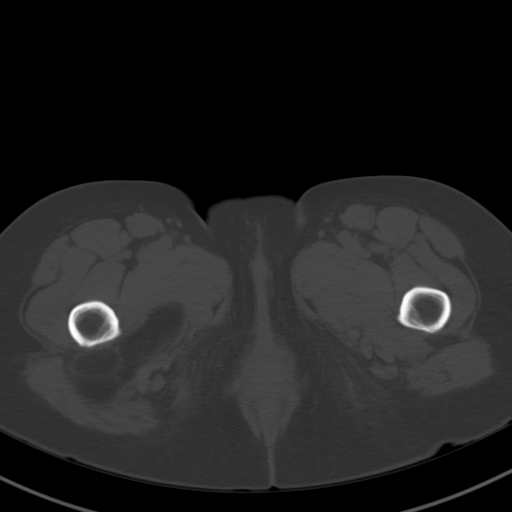
[im 12/92  soft-tissue]
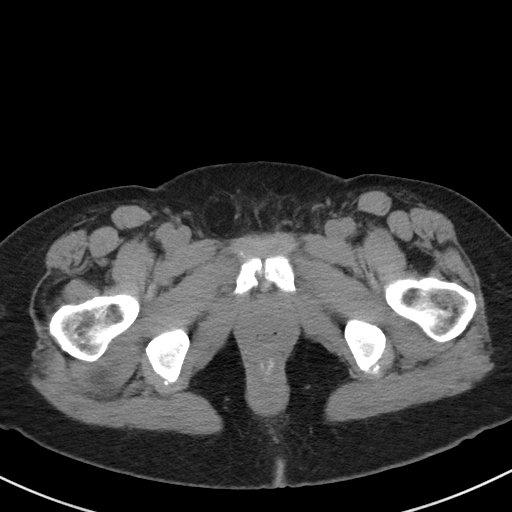
[im 20/92  soft-tissue]
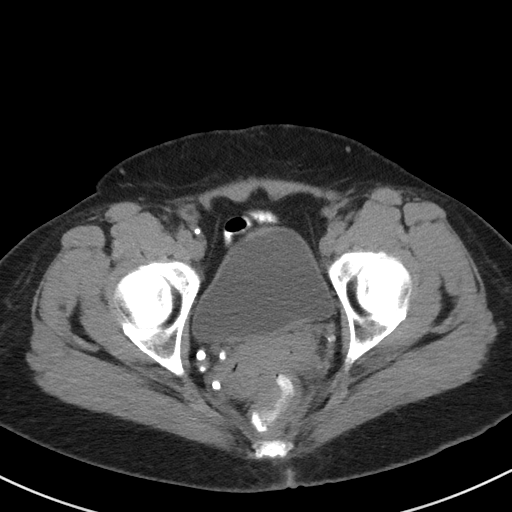
[im 28/92  soft-tissue]
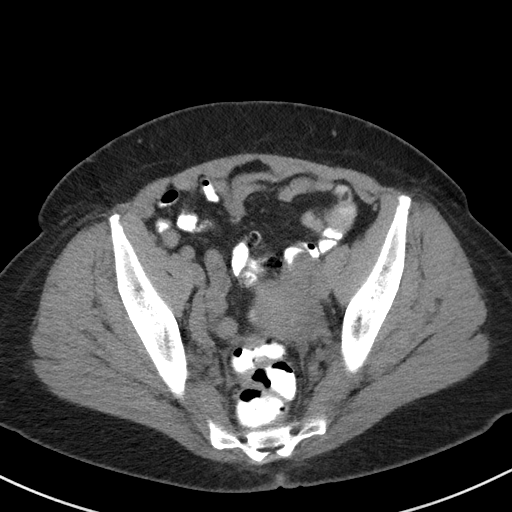
[im 36/92  soft-tissue]
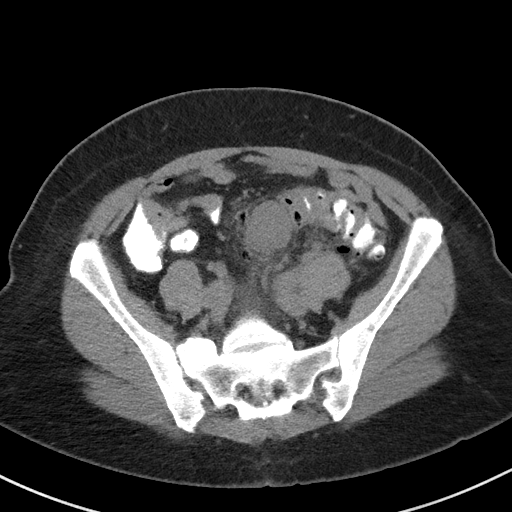
[im 44/92  soft-tissue]
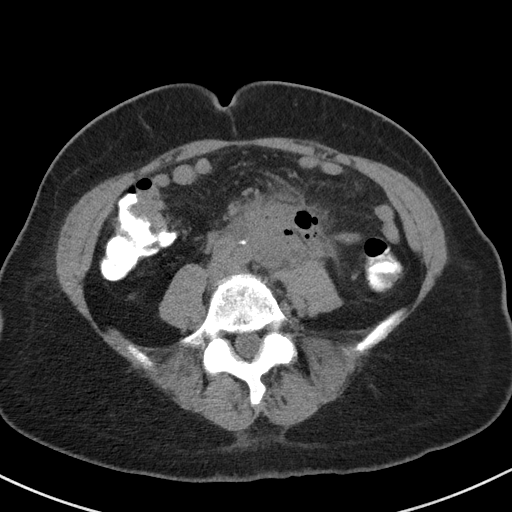
[im 48/92  soft-tissue]
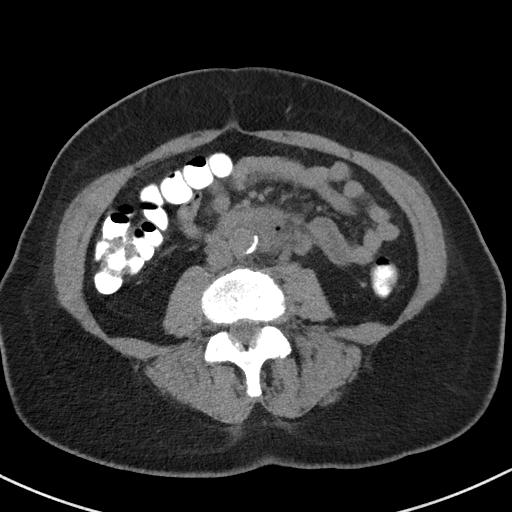
[im 56/92  soft-tissue]
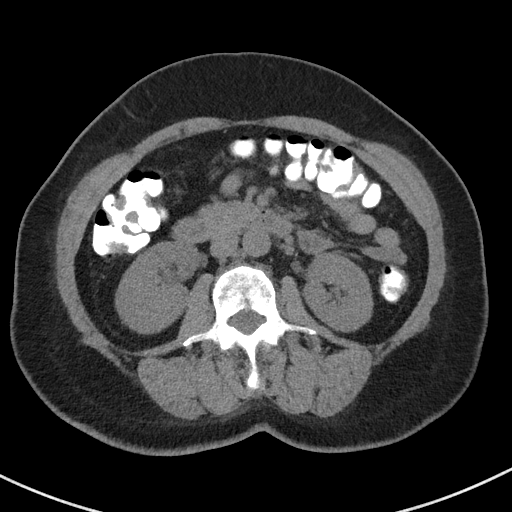
[im 64/92  soft-tissue]
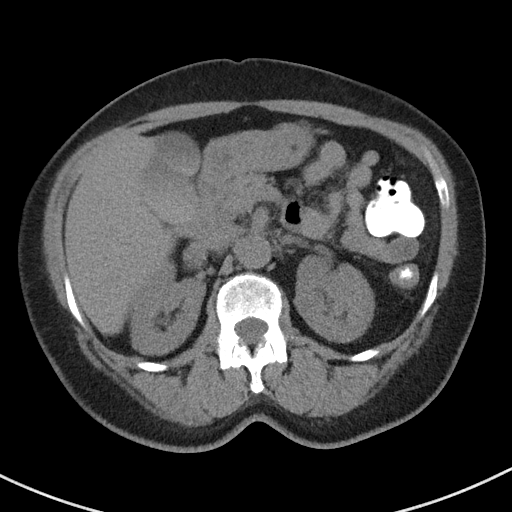
[im 64/92  bone]
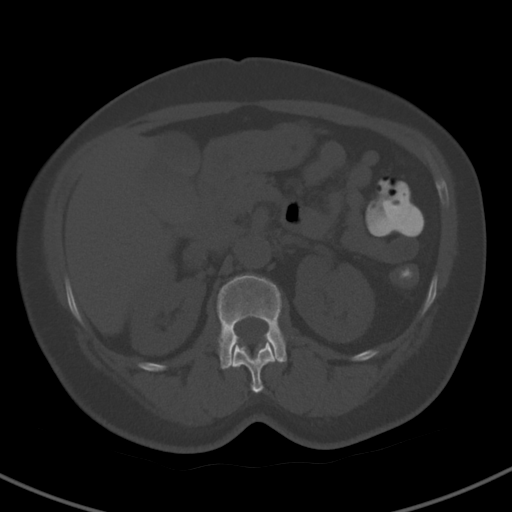
[im 72/92  soft-tissue]
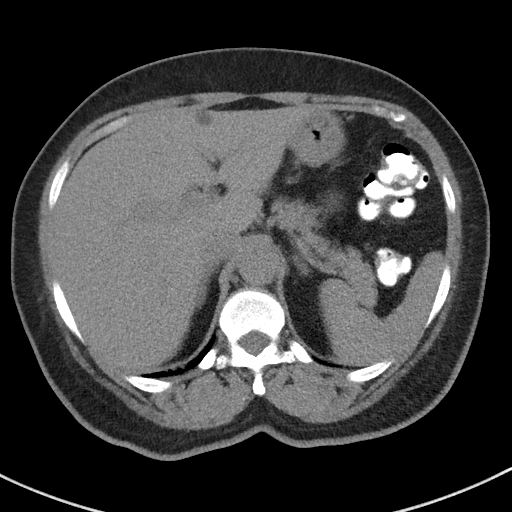
[im 80/92  soft-tissue]
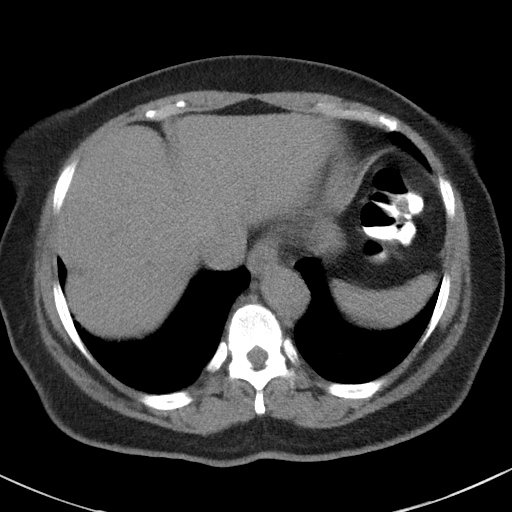
[im 88/92  soft-tissue]
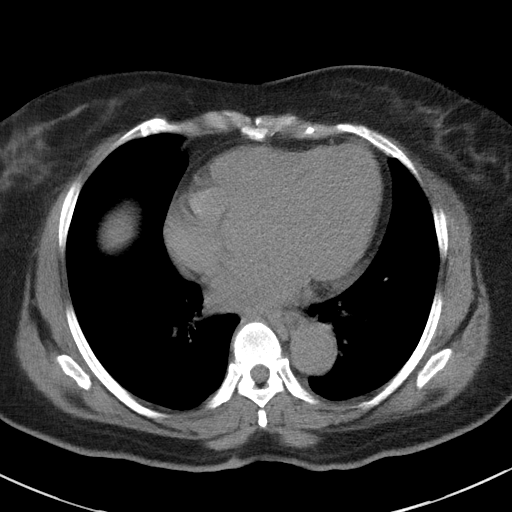

[Series 5: coronal st · coronal · 0.62mm/px · 3 of 78 slices shown]
[im 16/78  soft-tissue]
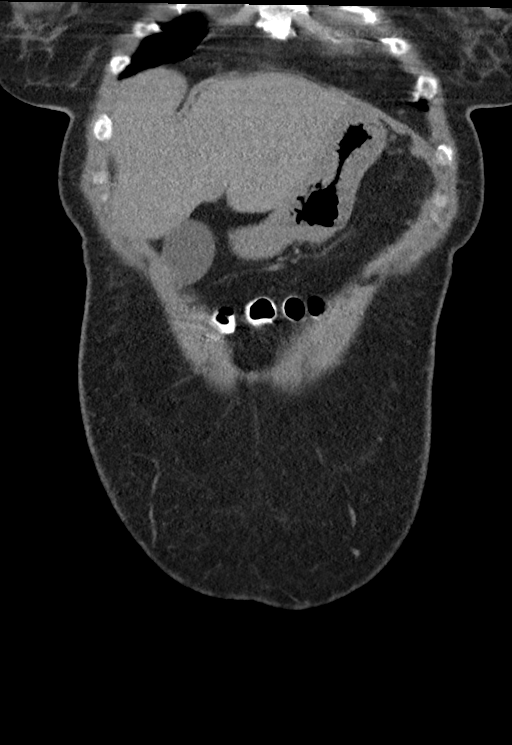
[im 31/78  soft-tissue]
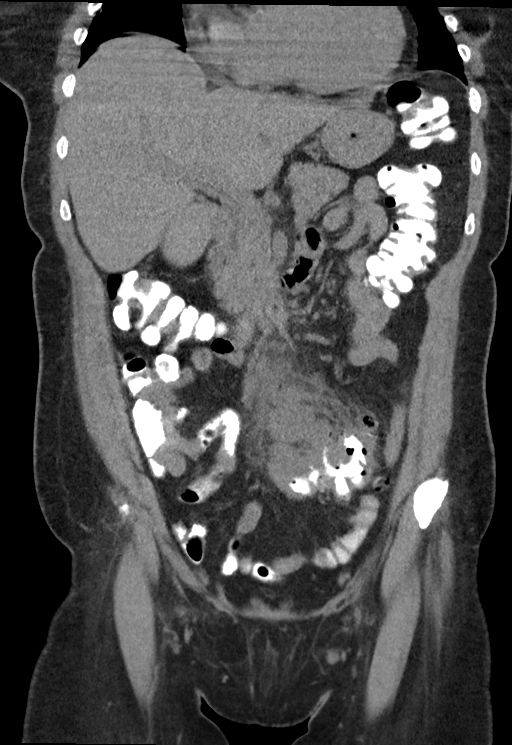
[im 47/78  soft-tissue]
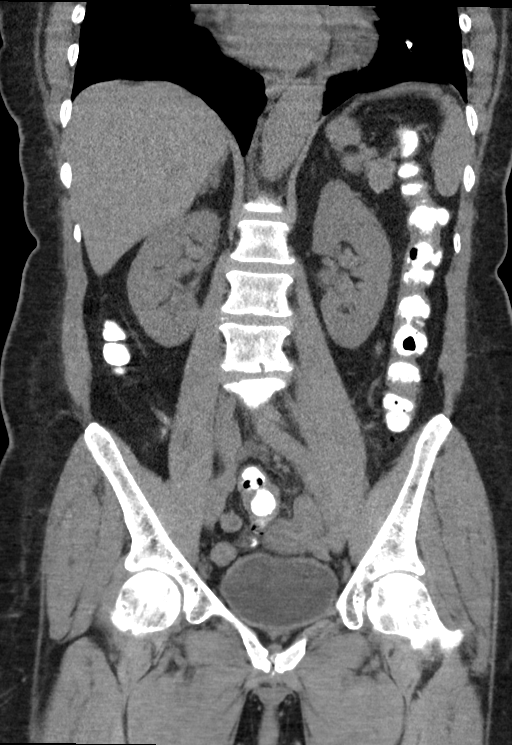

[15 of 46 positions shown; findings below may reference images not displayed]

FINDINGS: Lower chest: Lung bases demonstrate no acute consolidation or
pleural effusion. Mild cardiomegaly.

Hepatobiliary: Small cyst in the anterior left hepatic lobe.
Probable focal steatosis near the falciform ligament. High density
material in the gallbladder may reflect sludge or vicarious
excretion. No biliary dilatation

Pancreas: Unremarkable. No pancreatic ductal dilatation or
surrounding inflammatory changes.

Spleen: Normal in size without focal abnormality.

Adrenals/Urinary Tract: Adrenal glands are unremarkable. Kidneys are
normal, without renal calculi, focal lesion, or hydronephrosis.
Bladder is unremarkable.

Stomach/Bowel: No dilated small bowel. The stomach is within normal
limits. Wall thickening of the sigmoid colon. This is contiguous
with a soft tissue density within the upper pelvis slightly to the
left of midline measuring 4.4 cm by 3.7 cm that extends cephalad
from the inflamed sigmoid colon. Multiple foci of extraluminal gas
in the region. Moderate surrounding soft tissue inflammatory
changes. Negative appendix.

Vascular/Lymphatic: Aortic atherosclerosis. Subcentimeter pelvic
nodes.

Reproductive: Uterus and bilateral adnexa are unremarkable.

Other: No significant pelvic effusion.  Fat in the umbilical region

Musculoskeletal: Degenerative changes of the spine. Fat density mass
lesion measuring 9.1 cm posterior within the quadratus femoris and
abductor muscles, likely representing intramuscular lipoma.
IMPRESSION: 1. Focal wall thickening of the sigmoid:, consistent with
diverticulitis. 3.7 x 4.4 cm masslike extension off the inflamed
sigmoid colon with surrounding foci of extraluminal gas and
moderate-to-marked pericolonic inflammation. Further
characterisation of the mass is limited without intravenous
contrast. Findings could be secondary to perforated diverticulitis
with abscess. Perforated colon mass is also included in the
differential, given shape and configuration of the lesion.
2. High density sludge versus vicarious excretion of contrast within
the gallbladder.
3. Large intramuscular fat density mass/probable lipoma within the
posterior right hip

## 2020-01-05 ENCOUNTER — Other Ambulatory Visit: Payer: Self-pay | Admitting: Internal Medicine

## 2020-01-05 DIAGNOSIS — Z1231 Encounter for screening mammogram for malignant neoplasm of breast: Secondary | ICD-10-CM

## 2020-03-21 ENCOUNTER — Other Ambulatory Visit: Payer: Self-pay | Admitting: Internal Medicine

## 2020-03-21 ENCOUNTER — Ambulatory Visit
Admission: RE | Admit: 2020-03-21 | Discharge: 2020-03-21 | Disposition: A | Discharge status: Home / Self Care | Payer: Medicare HMO | Source: Ambulatory Visit | Attending: Internal Medicine | Admitting: Internal Medicine

## 2020-03-21 ENCOUNTER — Other Ambulatory Visit: Payer: Self-pay

## 2020-03-21 DIAGNOSIS — M79605 Pain in left leg: Secondary | ICD-10-CM | POA: Diagnosis not present

## 2020-03-22 ENCOUNTER — Telehealth: Payer: Self-pay

## 2020-03-22 ENCOUNTER — Other Ambulatory Visit: Payer: Self-pay

## 2020-03-22 DIAGNOSIS — R748 Abnormal levels of other serum enzymes: Secondary | ICD-10-CM

## 2020-03-22 NOTE — Telephone Encounter (Signed)
-----   Message from [place], [place] sent at 08/26/2019  9:02 AM EDT ----- Recheck LFT's due to elevated alkaline phos

## 2020-03-22 NOTE — Telephone Encounter (Signed)
Contacted pt and advised its time to repeat LFT's. Pt will come to the [place] office next Thursday, 03/29/20 to have this done.

## 2020-06-07 ENCOUNTER — Encounter: Payer: Self-pay | Admitting: Internal Medicine

## 2020-06-16 DIAGNOSIS — D649 Anemia, unspecified: Secondary | ICD-10-CM | POA: Insufficient documentation

## 2020-06-16 NOTE — Progress Notes (Signed)
[name]  Telephone:([phone]) [phone] [phone]) [phone]  ID: Helen May OB: [birth year]  MR#: [id number]  LKG#:[id number]  Patient Care Team: Casilda Carls, MD as PCP - General (Internal Medicine) Casilda Carls, MD (Internal Medicine)  CHIEF COMPLAINT: Polycythemia, elevated B12 levels.  INTERVAL HISTORY: Patient is a 75 year old female who was noted to have an elevated red blood cell count as well as elevated B12 level on routine blood work.  She is referred for further evaluation.  She currently feels well and is asymptomatic.  She denies any weakness or fatigue.  She has no neurologic complaints.  She denies any recent fevers or illnesses.  She has a good appetite and denies weight loss.  She has no chest pain, shortness of breath, cough, or hemoptysis.  She denies any nausea, vomiting, constipation, or diarrhea.  She has no urinary complaints.  Patient feels at her baseline offers no specific complaints today.  REVIEW OF SYSTEMS:   Review of Systems  Constitutional: Negative.  Negative for fever, malaise/fatigue and weight loss.  Respiratory: Negative.  Negative for cough, hemoptysis and shortness of breath.   Cardiovascular: Negative.  Negative for chest pain and leg swelling.  Gastrointestinal: Negative.   Genitourinary: Negative.  Negative for dysuria.  Musculoskeletal: Negative.  Negative for back pain.  Skin: Negative.  Negative for rash.  Neurological: Negative.  Negative for dizziness, focal weakness, weakness and headaches.  Psychiatric/Behavioral: Negative.  The patient is not nervous/anxious.     As per HPI. Otherwise, a complete review of systems is negative.  PAST MEDICAL HISTORY: Past Medical History:  Diagnosis Date  . Arthritis   . Asthma   . Hyperlipidemia   . Hypertension   . Pneumonia     PAST SURGICAL HISTORY: Past Surgical History:  Procedure Laterality Date  . COLONOSCOPY WITH PROPOFOL N/A 02/15/2019   Procedure:  COLONOSCOPY WITH PROPOFOL;  Surgeon: Lucilla Lame, MD;  Location: [facility] ENDOSCOPY;  Service: Endoscopy;  Laterality: N/A;  . Removal of Carotid Mass Right 15 years ago  . TUBAL LIGATION  1972    FAMILY HISTORY: Family History  Problem Relation Age of Onset  . Cervical cancer Daughter   . Breast cancer Neg Hx     ADVANCED DIRECTIVES (Y/N):  N  HEALTH MAINTENANCE: Social History   Tobacco Use  . Smoking status: Former Smoker    Years: 20.00    Quit date: 03/10/1993    Years since quitting: 27.2  . Smokeless tobacco: Never Used  Vaping Use  . Vaping Use: Never used  Substance Use Topics  . Alcohol use: No    Alcohol/week: 0.0 standard drinks  . Drug use: No     Colonoscopy:  PAP:  Bone density:  Lipid panel:  Allergies  Allergen Reactions  . Eggs Or Egg-Derived Products     Egg whites   . Other     Cigarette smoke     Current Outpatient Medications  Medication Sig Dispense Refill  . amLODipine (NORVASC) 5 MG tablet Take 5 mg by mouth daily.    . hydrochlorothiazide (HYDRODIURIL) 25 MG tablet Take 25 mg by mouth daily.    Marland Kitchen olopatadine (PATANOL) 0.1 % ophthalmic solution     . sodium chloride flush (NS) 0.9 % SOLN 5 mLs by Intracatheter route every 8 (eight) hours. (Patient not taking: No sig reported) 50 Syringe 0   No current facility-administered medications for this visit.    OBJECTIVE: Vitals:   06/19/20 1135  BP: (!) 151/91  Pulse:  63  Resp: 20  Temp: (!) 97.5 F (36.4 C)     Body mass index is 31.64 kg/m.    ECOG FS:0 - Asymptomatic  General: Well-developed, well-nourished, no acute distress. Eyes: Pink conjunctiva, anicteric sclera. HEENT: Normocephalic, moist mucous membranes. Lungs: No audible wheezing or coughing. Heart: Regular rate and rhythm. Abdomen: Soft, nontender, no obvious distention. Musculoskeletal: No edema, cyanosis, or clubbing. Neuro: Alert, answering all questions appropriately. Cranial nerves grossly intact. Skin: No rashes  or petechiae noted. Psych: Normal affect. Lymphatics: No cervical, calvicular, axillary or inguinal LAD.   LAB RESULTS:  Lab Results  Component Value Date   NA 139 11/21/2017   K 3.4 (L) 11/21/2017   CL 108 11/21/2017   CO2 24 11/21/2017   GLUCOSE 91 11/21/2017   BUN 9 11/21/2017   CREATININE 0.77 11/21/2017   CALCIUM 9.1 11/21/2017   PROT 7.4 08/18/2019   ALBUMIN 4.5 08/18/2019   AST 15 08/18/2019   ALT 12 08/18/2019   ALKPHOS 130 (H) 08/18/2019   BILITOT 0.3 08/18/2019   GFRNONAA >60 11/21/2017   GFRAA >60 11/21/2017    Lab Results  Component Value Date   WBC 4.4 06/19/2020   NEUTROABS 4.7 11/22/2017   HGB 14.9 06/19/2020   HCT 45.9 06/19/2020   MCV 88.6 06/19/2020   PLT 219 06/19/2020     STUDIES: No results found.  ASSESSMENT: Polycythemia, elevated B12 levels.  PLAN:    1.  Polycythemia: Resolved.  Patient's hemoglobin is 14.9 today.  All of her other laboratory work including iron stores is either negative or within normal limits.  Peripheral blood flow cytometry is pending at time of dictation. 2.  Elevated B12 levels: Improved from previous, but essentially chronic and unchanged.  Without macrocytosis this is likely clinically insignificant.  Patient states she does not take B12 supplementation.  No intervention is needed.  Patient does not require bone marrow biopsy.  Return to clinic in 3 weeks with video assisted telemedicine visit for further evaluation and discussion of her laboratory results.  I spent a total of 45 minutes reviewing chart data, face-to-face evaluation with the patient, counseling and coordination of care as detailed above.   Patient expressed understanding and was in agreement with this plan. She also understands that She can call clinic at any time with any questions, concerns, or complaints.    Lloyd Huger, MD   06/20/2020 6:37 AM

## 2020-06-19 ENCOUNTER — Inpatient Hospital Stay: Payer: Medicare HMO

## 2020-06-19 ENCOUNTER — Inpatient Hospital Stay: Payer: Medicare HMO | Attending: Oncology | Admitting: Oncology

## 2020-06-19 ENCOUNTER — Encounter: Payer: Self-pay | Admitting: Oncology

## 2020-06-19 VITALS — BP 151/91 | HR 63 | Temp 97.5°F | Resp 20 | Wt 178.6 lb

## 2020-06-19 DIAGNOSIS — Z87891 Personal history of nicotine dependence: Secondary | ICD-10-CM | POA: Insufficient documentation

## 2020-06-19 DIAGNOSIS — Z79899 Other long term (current) drug therapy: Secondary | ICD-10-CM | POA: Diagnosis not present

## 2020-06-19 DIAGNOSIS — D519 Vitamin B12 deficiency anemia, unspecified: Secondary | ICD-10-CM

## 2020-06-19 DIAGNOSIS — Z808 Family history of malignant neoplasm of other organs or systems: Secondary | ICD-10-CM | POA: Diagnosis not present

## 2020-06-19 DIAGNOSIS — D751 Secondary polycythemia: Secondary | ICD-10-CM | POA: Insufficient documentation

## 2020-06-19 DIAGNOSIS — D510 Vitamin B12 deficiency anemia due to intrinsic factor deficiency: Secondary | ICD-10-CM | POA: Diagnosis not present

## 2020-06-19 LAB — LACTATE DEHYDROGENASE: LDH: 148 U/L (ref 98–192)

## 2020-06-19 LAB — CBC
HCT: 45.9 % (ref 36.0–46.0)
Hemoglobin: 14.9 g/dL (ref 12.0–15.0)
MCH: 28.8 pg (ref 26.0–34.0)
MCHC: 32.5 g/dL (ref 30.0–36.0)
MCV: 88.6 fL (ref 80.0–100.0)
Platelets: 219 K/uL (ref 150–400)
RBC: 5.18 MIL/uL — ABNORMAL HIGH (ref 3.87–5.11)
RDW: 13.9 % (ref 11.5–15.5)
WBC: 4.4 K/uL (ref 4.0–10.5)
nRBC: 0 % (ref 0.0–0.2)

## 2020-06-19 LAB — IRON AND TIBC
Iron: 82 ug/dL (ref 28–170)
Saturation Ratios: 22 % (ref 10.4–31.8)
TIBC: 374 ug/dL (ref 250–450)
UIBC: 292 ug/dL

## 2020-06-19 LAB — FOLATE: Folate: 12.9 ng/mL (ref >5.9)

## 2020-06-19 LAB — FERRITIN: Ferritin: 115 ng/mL (ref 11–307)

## 2020-06-19 LAB — VITAMIN B12: Vitamin B-12: 1129 pg/mL — ABNORMAL HIGH (ref 180–914)

## 2020-06-20 DIAGNOSIS — D751 Secondary polycythemia: Secondary | ICD-10-CM | POA: Insufficient documentation

## 2020-06-21 LAB — COMP PANEL: LEUKEMIA/LYMPHOMA

## 2020-06-28 ENCOUNTER — Ambulatory Visit: Payer: Medicare HMO | Admitting: Gastroenterology

## 2020-07-07 NOTE — Progress Notes (Signed)
  [place]  [phone] [phone]) [phone]  ID: Helen May OB: 09-03-1945  MR#: [id number]  YBF#:[id number]  Patient Care Team: Casilda Carls, MD as PCP - General (Internal Medicine) Casilda Carls, MD (Internal Medicine)    Lloyd Huger, MD   07/12/2020 6:59 AM     This encounter was created in error - please disregard.

## 2020-07-10 ENCOUNTER — Other Ambulatory Visit: Payer: Self-pay

## 2020-07-10 ENCOUNTER — Inpatient Hospital Stay: Payer: Medicare HMO | Attending: Oncology | Admitting: Oncology

## 2020-07-10 ENCOUNTER — Encounter: Payer: Self-pay | Admitting: Oncology

## 2020-07-10 DIAGNOSIS — D751 Secondary polycythemia: Secondary | ICD-10-CM

## 2020-07-10 NOTE — Progress Notes (Signed)
Patient scheduled for video visit this afternoon to review lab results. Patient reports easy bruising.

## 2020-12-27 ENCOUNTER — Other Ambulatory Visit: Payer: Self-pay | Admitting: Internal Medicine

## 2020-12-27 ENCOUNTER — Telehealth: Payer: Self-pay | Admitting: Cardiovascular Disease

## 2020-12-27 DIAGNOSIS — R5383 Other fatigue: Secondary | ICD-10-CM

## 2020-12-27 DIAGNOSIS — R42 Dizziness and giddiness: Secondary | ICD-10-CM

## 2020-12-27 DIAGNOSIS — R002 Palpitations: Secondary | ICD-10-CM

## 2020-12-27 NOTE — Telephone Encounter (Signed)
Helen May is requesting a call from Dr. Fletcher Anon regarding an appt asap next available new patient end of November is not soon enough.

## 2020-12-28 NOTE — Telephone Encounter (Signed)
Per Dr. Rosario Jacks please have Dr. Fletcher Anon only call when available to discuss patient  Scheduled 11/29 added to waitlist

## 2020-12-31 ENCOUNTER — Other Ambulatory Visit: Payer: Self-pay

## 2020-12-31 ENCOUNTER — Ambulatory Visit
Admission: RE | Admit: 2020-12-31 | Discharge: 2020-12-31 | Disposition: A | Discharge status: Home / Self Care | Payer: Medicare HMO | Source: Ambulatory Visit | Attending: Internal Medicine | Admitting: Internal Medicine

## 2020-12-31 DIAGNOSIS — R5383 Other fatigue: Secondary | ICD-10-CM | POA: Insufficient documentation

## 2020-12-31 DIAGNOSIS — R42 Dizziness and giddiness: Secondary | ICD-10-CM | POA: Diagnosis present

## 2020-12-31 DIAGNOSIS — R002 Palpitations: Secondary | ICD-10-CM | POA: Insufficient documentation

## 2021-02-05 ENCOUNTER — Ambulatory Visit: Payer: Medicare HMO | Admitting: Cardiovascular Disease

## 2021-02-14 ENCOUNTER — Other Ambulatory Visit
Admission: RE | Admit: 2021-02-14 | Discharge: 2021-02-14 | Disposition: A | Discharge status: Home / Self Care | Payer: Medicare HMO | Source: Ambulatory Visit | Attending: Pulmonary Disease | Admitting: Pulmonary Disease

## 2021-02-14 DIAGNOSIS — J984 Other disorders of lung: Secondary | ICD-10-CM | POA: Diagnosis present

## 2021-02-14 LAB — D-DIMER, QUANTITATIVE: D-Dimer, Quant: 0.41 ug/mL-FEU (ref 0.00–0.50)

## 2021-03-14 IMAGING — US US ABDOMEN LIMITED
1 series · 14 of 25 positions shown · non-contrast
Comparison: CT of the abdomen pelvis dated 11/20/2017.

CLINICAL DATA: 73-year-old female with elevated liver enzymes.

EXAM:
ULTRASOUND ABDOMEN LIMITED RIGHT UPPER QUADRANT

[Series 1: us abdomen limited · 0.23mm/px · 14 of 46 slices shown]
[im 1/46]
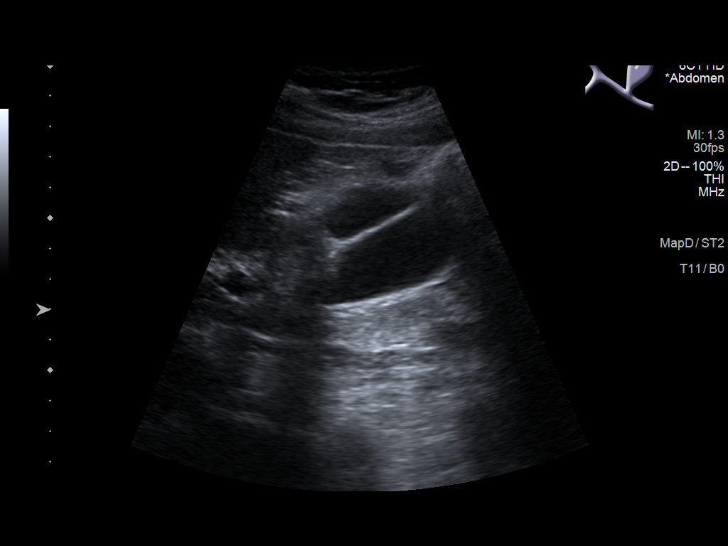
[im 4/46]
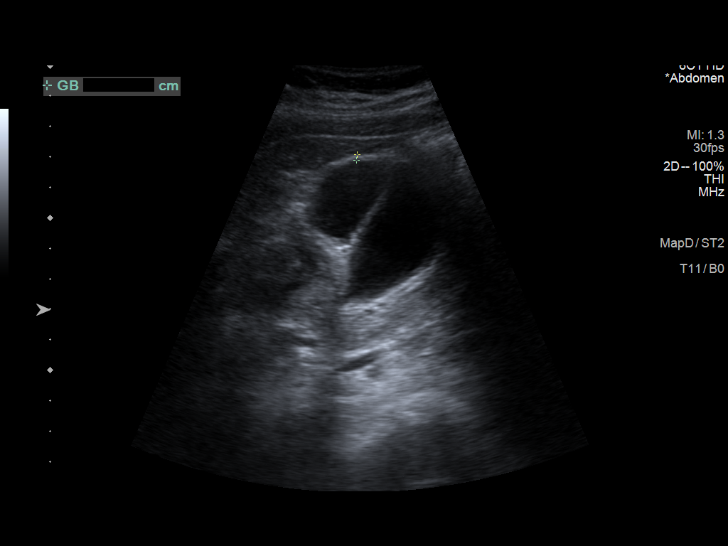
[im 8/46]
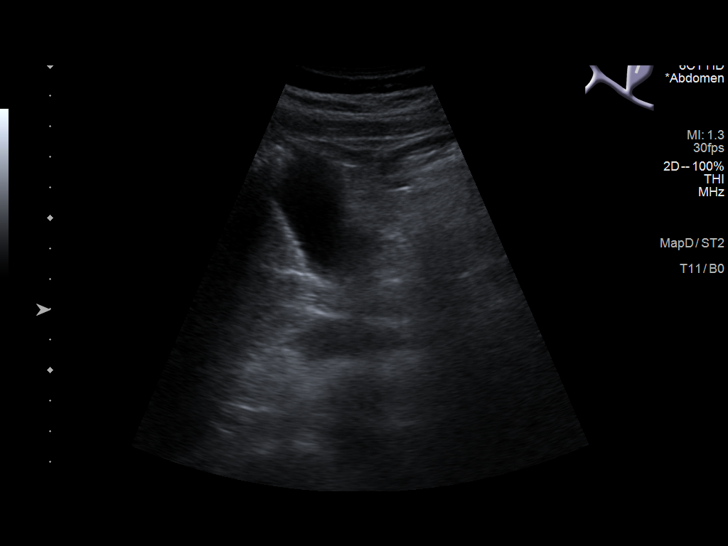
[im 12/46]
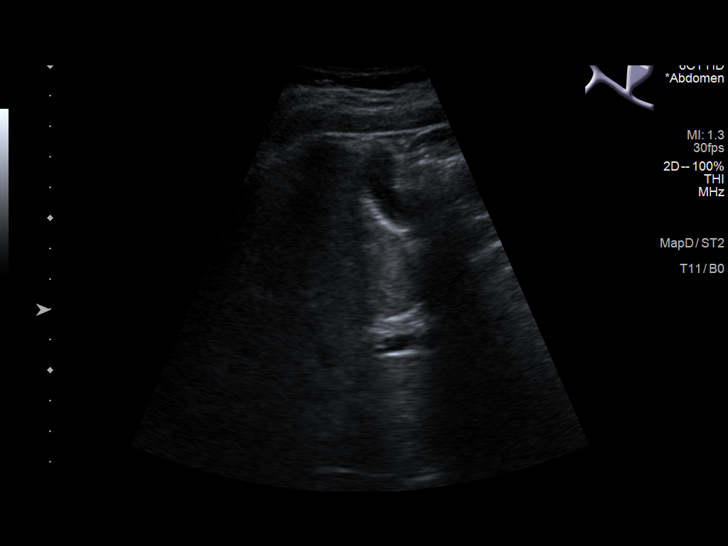
[im 16/46]
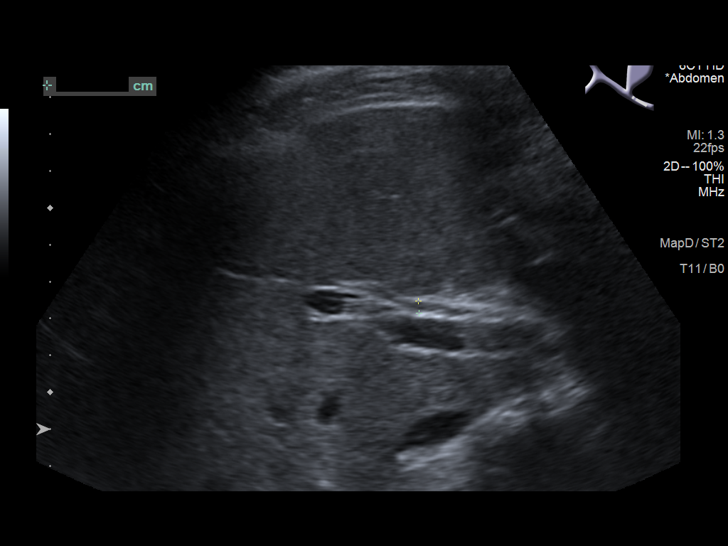
[im 17/46]
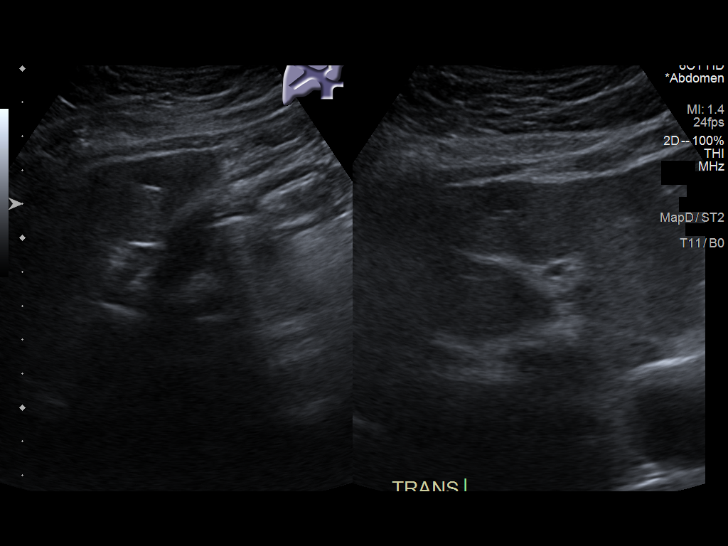
[im 21/46]
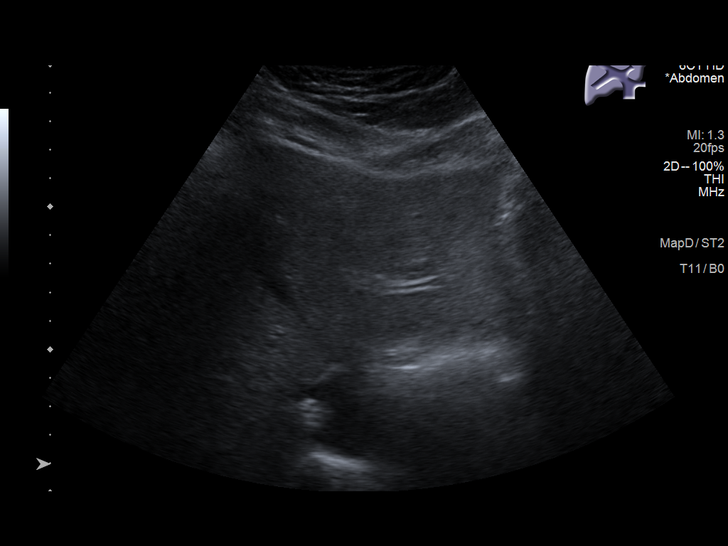
[im 25/46]
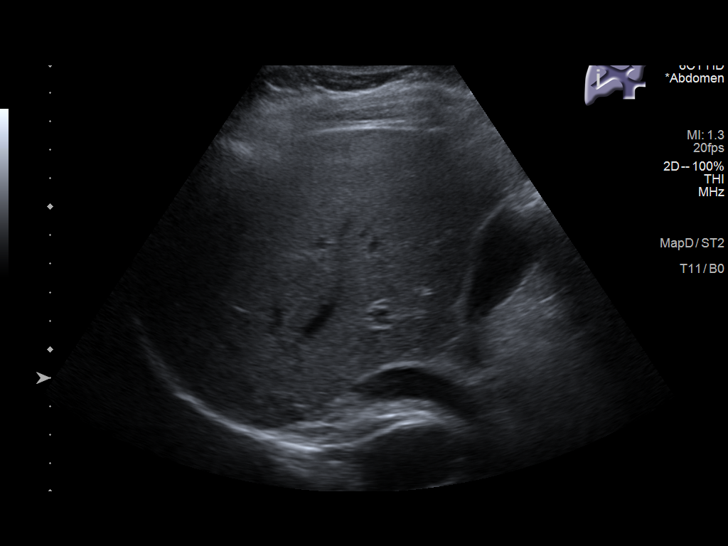
[im 29/46]
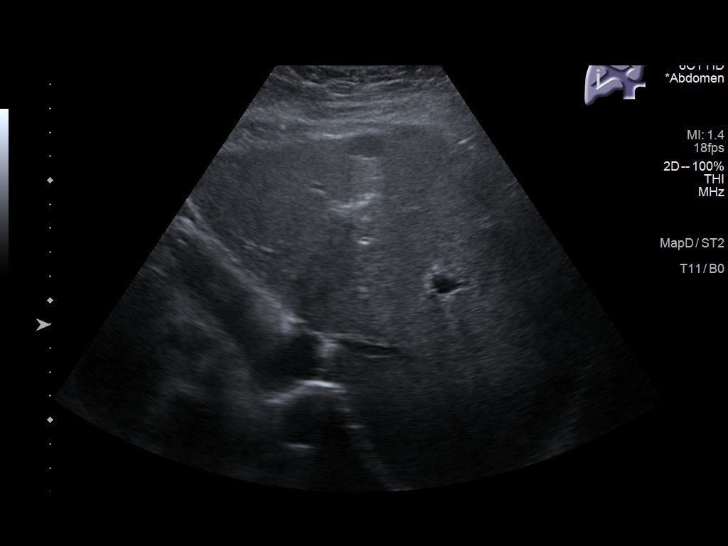
[im 31/46]
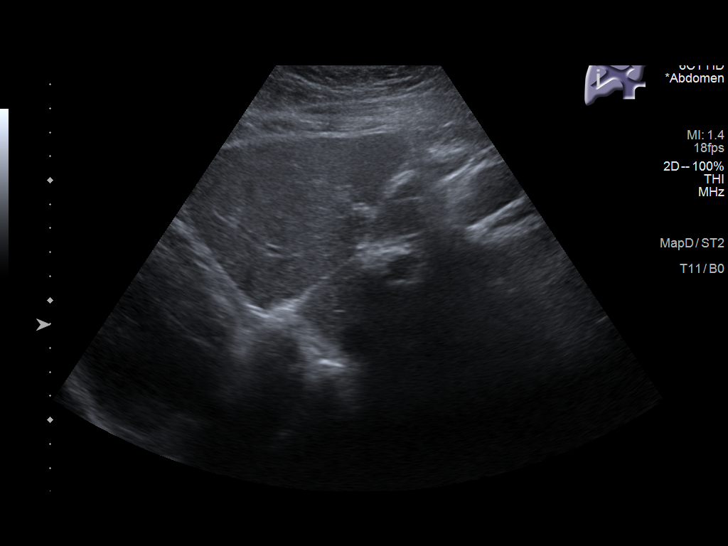
[im 34/46]
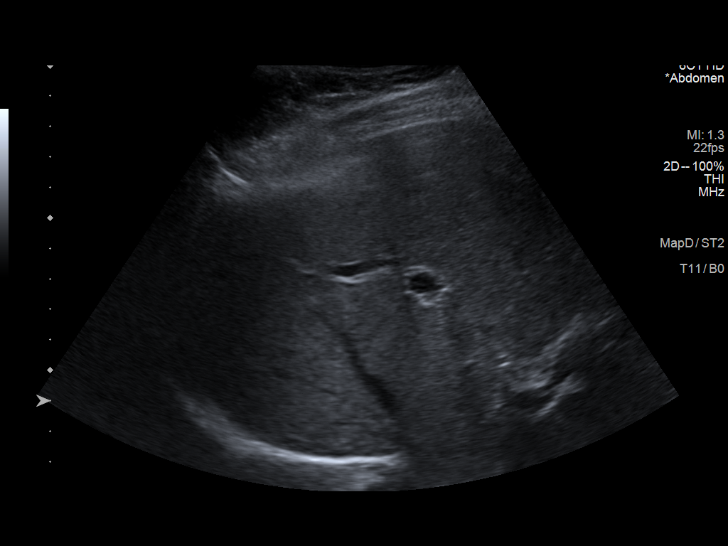
[im 38/46]
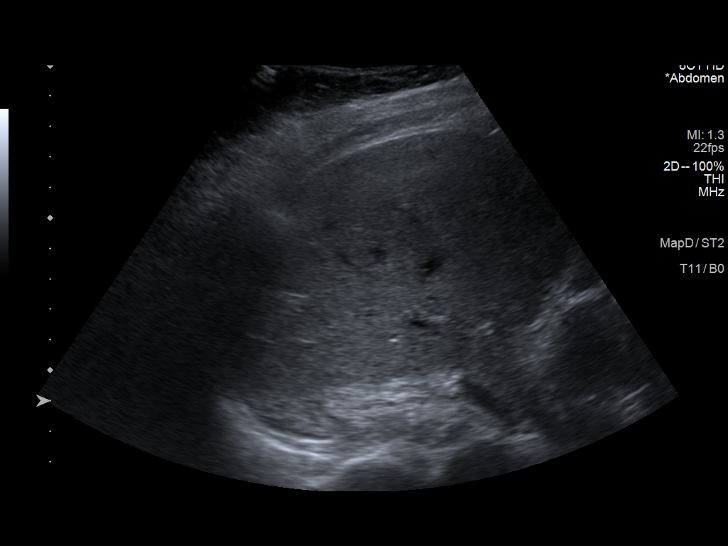
[im 42/46]
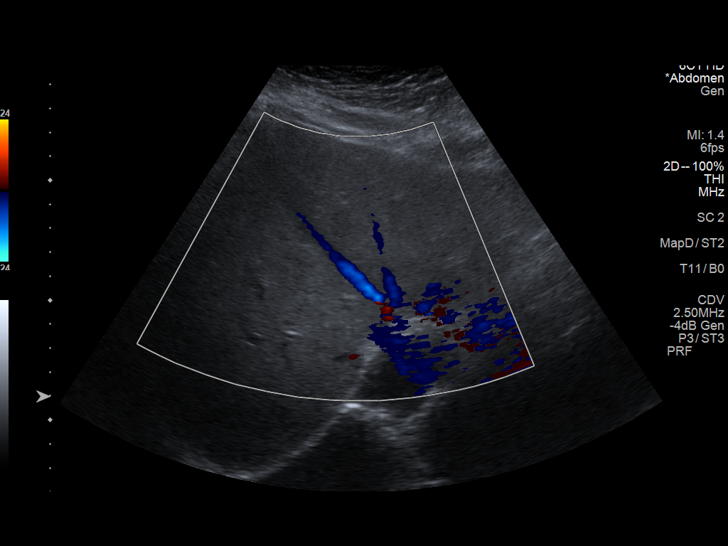
[im 46/46]
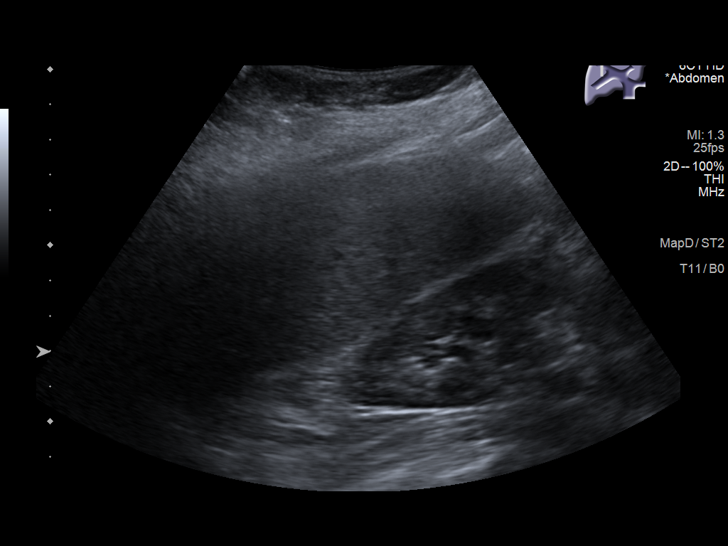

[14 of 25 positions shown; findings below may reference images not displayed]

FINDINGS: Gallbladder:

No gallstones or wall thickening visualized. No sonographic Murphy
sign noted by sonographer.

Common bile duct:

Diameter: 3 mm

Liver:

There is a 1.4 x 1.1 x 1.2 cm cyst in the left lobe of the liver
likely corresponding to the cyst seen on the prior CT. The liver is
otherwise unremarkable. Portal vein is patent on color Doppler
imaging with normal direction of blood flow towards the liver.

Other: None.
IMPRESSION: 1. No gallstone.
2. Small left hepatic cyst.

## 2021-06-04 ENCOUNTER — Other Ambulatory Visit: Payer: Self-pay | Admitting: Internal Medicine

## 2021-06-04 DIAGNOSIS — Z1231 Encounter for screening mammogram for malignant neoplasm of breast: Secondary | ICD-10-CM

## 2021-06-04 DIAGNOSIS — Z13820 Encounter for screening for osteoporosis: Secondary | ICD-10-CM

## 2021-06-14 ENCOUNTER — Other Ambulatory Visit: Payer: Self-pay | Admitting: Internal Medicine

## 2021-06-14 DIAGNOSIS — R269 Unspecified abnormalities of gait and mobility: Secondary | ICD-10-CM

## 2021-06-14 DIAGNOSIS — R42 Dizziness and giddiness: Secondary | ICD-10-CM

## 2021-06-20 ENCOUNTER — Ambulatory Visit
Admission: RE | Admit: 2021-06-20 | Discharge: 2021-06-20 | Disposition: A | Discharge status: Home / Self Care | Payer: Medicare HMO | Source: Ambulatory Visit | Attending: Internal Medicine | Admitting: Internal Medicine

## 2021-06-20 DIAGNOSIS — R269 Unspecified abnormalities of gait and mobility: Secondary | ICD-10-CM

## 2021-06-20 DIAGNOSIS — R42 Dizziness and giddiness: Secondary | ICD-10-CM

## 2021-06-20 MED ORDER — GADOBENATE DIMEGLUMINE 529 MG/ML IV SOLN
15.0000 mL | Freq: Once | INTRAVENOUS | Status: AC | PRN
  Administered 2021-06-20: 15 mL via INTRAVENOUS

## 2021-07-30 ENCOUNTER — Ambulatory Visit
Admission: RE | Admit: 2021-07-30 | Discharge: 2021-07-30 | Disposition: A | Discharge status: Home / Self Care | Payer: Medicare HMO | Source: Ambulatory Visit | Attending: Internal Medicine | Admitting: Internal Medicine

## 2021-07-30 DIAGNOSIS — Z13820 Encounter for screening for osteoporosis: Secondary | ICD-10-CM | POA: Insufficient documentation

## 2021-07-30 DIAGNOSIS — M8588 Other specified disorders of bone density and structure, other site: Secondary | ICD-10-CM | POA: Insufficient documentation

## 2021-07-30 DIAGNOSIS — J45909 Unspecified asthma, uncomplicated: Secondary | ICD-10-CM | POA: Insufficient documentation

## 2021-07-30 DIAGNOSIS — Z79899 Other long term (current) drug therapy: Secondary | ICD-10-CM | POA: Insufficient documentation

## 2021-07-30 DIAGNOSIS — Z780 Asymptomatic menopausal state: Secondary | ICD-10-CM | POA: Diagnosis not present

## 2021-07-30 DIAGNOSIS — Z1231 Encounter for screening mammogram for malignant neoplasm of breast: Secondary | ICD-10-CM | POA: Insufficient documentation

## 2021-07-31 ENCOUNTER — Inpatient Hospital Stay
Admission: RE | Admit: 2021-07-31 | Discharge: 2021-07-31 | Disposition: A | Discharge status: Home / Self Care | Payer: Self-pay | Source: Ambulatory Visit | Attending: *Deleted | Admitting: *Deleted

## 2021-07-31 ENCOUNTER — Other Ambulatory Visit: Payer: Self-pay | Admitting: Internal Medicine

## 2021-07-31 ENCOUNTER — Other Ambulatory Visit: Payer: Self-pay | Admitting: *Deleted

## 2021-07-31 DIAGNOSIS — Z1231 Encounter for screening mammogram for malignant neoplasm of breast: Secondary | ICD-10-CM

## 2021-08-01 ENCOUNTER — Other Ambulatory Visit: Payer: Self-pay | Admitting: Internal Medicine

## 2022-12-01 ENCOUNTER — Other Ambulatory Visit: Payer: Self-pay | Admitting: Internal Medicine

## 2022-12-01 DIAGNOSIS — Z1231 Encounter for screening mammogram for malignant neoplasm of breast: Secondary | ICD-10-CM

## 2023-05-05 ENCOUNTER — Other Ambulatory Visit: Payer: Self-pay | Admitting: Internal Medicine

## 2023-05-05 DIAGNOSIS — T1490XA Injury, unspecified, initial encounter: Secondary | ICD-10-CM
